# Patient Record
Sex: Male | Born: 1945 | Race: White | Hispanic: No | Smoking: Smoker, current status unknown
Health system: Southern US, Community
[De-identification: ages and names within clinical notes are randomized; demographics above are authoritative.]

## PROBLEM LIST (undated history)

## (undated) DIAGNOSIS — Z720 Tobacco use: Secondary | ICD-10-CM

## (undated) DIAGNOSIS — I10 Essential (primary) hypertension: Secondary | ICD-10-CM

## (undated) DIAGNOSIS — E785 Hyperlipidemia, unspecified: Secondary | ICD-10-CM

## (undated) DIAGNOSIS — M1990 Unspecified osteoarthritis, unspecified site: Secondary | ICD-10-CM

## (undated) DIAGNOSIS — J189 Pneumonia, unspecified organism: Secondary | ICD-10-CM

## (undated) DIAGNOSIS — J449 Chronic obstructive pulmonary disease, unspecified: Secondary | ICD-10-CM

## (undated) DIAGNOSIS — M109 Gout, unspecified: Secondary | ICD-10-CM

## (undated) DIAGNOSIS — E119 Type 2 diabetes mellitus without complications: Secondary | ICD-10-CM

## (undated) HISTORY — PX: OTHER SURGICAL HISTORY: SHX169

---

## 2004-04-07 ENCOUNTER — Ambulatory Visit: Payer: Self-pay | Admitting: Oncology

## 2005-04-11 ENCOUNTER — Ambulatory Visit: Payer: Self-pay | Admitting: Oncology

## 2006-04-22 ENCOUNTER — Ambulatory Visit: Payer: Self-pay | Admitting: Oncology

## 2015-08-05 ENCOUNTER — Inpatient Hospital Stay (HOSPITAL_COMMUNITY): Payer: Medicare Other

## 2015-08-05 ENCOUNTER — Inpatient Hospital Stay (HOSPITAL_COMMUNITY)
Admission: AD | Admit: 2015-08-05 | Discharge: 2015-09-10 | DRG: 291 | Disposition: E | Payer: Medicare Other | Source: Other Acute Inpatient Hospital | Attending: Pulmonary Disease | Admitting: Pulmonary Disease

## 2015-08-05 ENCOUNTER — Encounter (HOSPITAL_COMMUNITY): Payer: Self-pay | Admitting: Pulmonary Disease

## 2015-08-05 DIAGNOSIS — J440 Chronic obstructive pulmonary disease with acute lower respiratory infection: Secondary | ICD-10-CM | POA: Diagnosis present

## 2015-08-05 DIAGNOSIS — J9811 Atelectasis: Secondary | ICD-10-CM | POA: Diagnosis present

## 2015-08-05 DIAGNOSIS — J9601 Acute respiratory failure with hypoxia: Secondary | ICD-10-CM

## 2015-08-05 DIAGNOSIS — G9341 Metabolic encephalopathy: Secondary | ICD-10-CM | POA: Diagnosis present

## 2015-08-05 DIAGNOSIS — R7989 Other specified abnormal findings of blood chemistry: Secondary | ICD-10-CM

## 2015-08-05 DIAGNOSIS — Z66 Do not resuscitate: Secondary | ICD-10-CM | POA: Diagnosis present

## 2015-08-05 DIAGNOSIS — Z794 Long term (current) use of insulin: Secondary | ICD-10-CM

## 2015-08-05 DIAGNOSIS — I509 Heart failure, unspecified: Secondary | ICD-10-CM | POA: Insufficient documentation

## 2015-08-05 DIAGNOSIS — I5031 Acute diastolic (congestive) heart failure: Secondary | ICD-10-CM | POA: Diagnosis present

## 2015-08-05 DIAGNOSIS — I5033 Acute on chronic diastolic (congestive) heart failure: Secondary | ICD-10-CM | POA: Diagnosis not present

## 2015-08-05 DIAGNOSIS — I2510 Atherosclerotic heart disease of native coronary artery without angina pectoris: Secondary | ICD-10-CM | POA: Diagnosis present

## 2015-08-05 DIAGNOSIS — F17200 Nicotine dependence, unspecified, uncomplicated: Secondary | ICD-10-CM | POA: Diagnosis present

## 2015-08-05 DIAGNOSIS — Z7902 Long term (current) use of antithrombotics/antiplatelets: Secondary | ICD-10-CM | POA: Diagnosis not present

## 2015-08-05 DIAGNOSIS — I110 Hypertensive heart disease with heart failure: Principal | ICD-10-CM | POA: Diagnosis present

## 2015-08-05 DIAGNOSIS — E1165 Type 2 diabetes mellitus with hyperglycemia: Secondary | ICD-10-CM | POA: Diagnosis present

## 2015-08-05 DIAGNOSIS — E119 Type 2 diabetes mellitus without complications: Secondary | ICD-10-CM | POA: Diagnosis present

## 2015-08-05 DIAGNOSIS — K761 Chronic passive congestion of liver: Secondary | ICD-10-CM | POA: Diagnosis present

## 2015-08-05 DIAGNOSIS — Z6841 Body Mass Index (BMI) 40.0 and over, adult: Secondary | ICD-10-CM | POA: Diagnosis not present

## 2015-08-05 DIAGNOSIS — D649 Anemia, unspecified: Secondary | ICD-10-CM | POA: Diagnosis present

## 2015-08-05 DIAGNOSIS — R509 Fever, unspecified: Secondary | ICD-10-CM | POA: Diagnosis not present

## 2015-08-05 DIAGNOSIS — M109 Gout, unspecified: Secondary | ICD-10-CM | POA: Diagnosis present

## 2015-08-05 DIAGNOSIS — E870 Hyperosmolality and hypernatremia: Secondary | ICD-10-CM | POA: Diagnosis present

## 2015-08-05 DIAGNOSIS — R451 Restlessness and agitation: Secondary | ICD-10-CM | POA: Diagnosis present

## 2015-08-05 DIAGNOSIS — I248 Other forms of acute ischemic heart disease: Secondary | ICD-10-CM | POA: Diagnosis present

## 2015-08-05 DIAGNOSIS — T17990A Other foreign object in respiratory tract, part unspecified in causing asphyxiation, initial encounter: Secondary | ICD-10-CM | POA: Diagnosis present

## 2015-08-05 DIAGNOSIS — Z79899 Other long term (current) drug therapy: Secondary | ICD-10-CM

## 2015-08-05 DIAGNOSIS — J189 Pneumonia, unspecified organism: Secondary | ICD-10-CM | POA: Diagnosis present

## 2015-08-05 DIAGNOSIS — Z4659 Encounter for fitting and adjustment of other gastrointestinal appliance and device: Secondary | ICD-10-CM | POA: Insufficient documentation

## 2015-08-05 DIAGNOSIS — R748 Abnormal levels of other serum enzymes: Secondary | ICD-10-CM

## 2015-08-05 DIAGNOSIS — J80 Acute respiratory distress syndrome: Secondary | ICD-10-CM | POA: Diagnosis not present

## 2015-08-05 DIAGNOSIS — J9600 Acute respiratory failure, unspecified whether with hypoxia or hypercapnia: Secondary | ICD-10-CM | POA: Diagnosis not present

## 2015-08-05 DIAGNOSIS — E785 Hyperlipidemia, unspecified: Secondary | ICD-10-CM | POA: Diagnosis present

## 2015-08-05 DIAGNOSIS — I712 Thoracic aortic aneurysm, without rupture: Secondary | ICD-10-CM | POA: Diagnosis present

## 2015-08-05 DIAGNOSIS — R945 Abnormal results of liver function studies: Secondary | ICD-10-CM

## 2015-08-05 DIAGNOSIS — I5020 Unspecified systolic (congestive) heart failure: Secondary | ICD-10-CM | POA: Diagnosis not present

## 2015-08-05 DIAGNOSIS — D696 Thrombocytopenia, unspecified: Secondary | ICD-10-CM | POA: Diagnosis present

## 2015-08-05 DIAGNOSIS — Z7982 Long term (current) use of aspirin: Secondary | ICD-10-CM

## 2015-08-05 DIAGNOSIS — Z515 Encounter for palliative care: Secondary | ICD-10-CM | POA: Diagnosis present

## 2015-08-05 DIAGNOSIS — Z4502 Encounter for adjustment and management of automatic implantable cardiac defibrillator: Secondary | ICD-10-CM | POA: Insufficient documentation

## 2015-08-05 DIAGNOSIS — Z781 Physical restraint status: Secondary | ICD-10-CM

## 2015-08-05 DIAGNOSIS — Z9289 Personal history of other medical treatment: Secondary | ICD-10-CM | POA: Insufficient documentation

## 2015-08-05 DIAGNOSIS — K5909 Other constipation: Secondary | ICD-10-CM | POA: Diagnosis not present

## 2015-08-05 DIAGNOSIS — Z885 Allergy status to narcotic agent status: Secondary | ICD-10-CM

## 2015-08-05 DIAGNOSIS — R0602 Shortness of breath: Secondary | ICD-10-CM | POA: Diagnosis present

## 2015-08-05 DIAGNOSIS — R739 Hyperglycemia, unspecified: Secondary | ICD-10-CM | POA: Diagnosis not present

## 2015-08-05 HISTORY — DX: Hyperlipidemia, unspecified: E78.5

## 2015-08-05 HISTORY — DX: Pneumonia, unspecified organism: J18.9

## 2015-08-05 HISTORY — DX: Chronic obstructive pulmonary disease, unspecified: J44.9

## 2015-08-05 HISTORY — DX: Tobacco use: Z72.0

## 2015-08-05 HISTORY — DX: Unspecified osteoarthritis, unspecified site: M19.90

## 2015-08-05 HISTORY — DX: Gout, unspecified: M10.9

## 2015-08-05 HISTORY — DX: Type 2 diabetes mellitus without complications: E11.9

## 2015-08-05 HISTORY — DX: Essential (primary) hypertension: I10

## 2015-08-05 LAB — GLUCOSE, CAPILLARY
GLUCOSE-CAPILLARY: 153 mg/dL — AB (ref 65–99)
Glucose-Capillary: 168 mg/dL — ABNORMAL HIGH (ref 65–99)

## 2015-08-05 LAB — POCT I-STAT 3, ART BLOOD GAS (G3+)
ACID-BASE EXCESS: 3 mmol/L — AB (ref 0.0–2.0)
Bicarbonate: 29.7 mEq/L — ABNORMAL HIGH (ref 20.0–24.0)
O2 SAT: 92 %
PCO2 ART: 49.2 mmHg — AB (ref 35.0–45.0)
PH ART: 7.389 (ref 7.350–7.450)
TCO2: 31 mmol/L (ref 0–100)
pO2, Arterial: 66 mmHg — ABNORMAL LOW (ref 80.0–100.0)

## 2015-08-05 LAB — TROPONIN I
TROPONIN I: 0.06 ng/mL — AB (ref <0.031)
TROPONIN I: 0.06 ng/mL — AB (ref <0.031)

## 2015-08-05 LAB — MRSA PCR SCREENING: MRSA BY PCR: NEGATIVE

## 2015-08-05 LAB — PROCALCITONIN: Procalcitonin: <0.1 ng/mL

## 2015-08-05 LAB — BRAIN NATRIURETIC PEPTIDE: B Natriuretic Peptide: 77.2 pg/mL (ref 0.0–100.0)

## 2015-08-05 LAB — LACTIC ACID, PLASMA: Lactic Acid, Venous: 1.9 mmol/L (ref 0.5–2.0)

## 2015-08-05 MED ORDER — VANCOMYCIN HCL IN DEXTROSE 1-5 GM/200ML-% IV SOLN
1000.0000 mg | Freq: Once | INTRAVENOUS | Status: DC
  Filled 2015-08-05: qty 200

## 2015-08-05 MED ORDER — ASPIRIN 81 MG PO CHEW: 324.0000 mg | CHEWABLE_TABLET | ORAL | Status: AC

## 2015-08-05 MED ORDER — MIDAZOLAM HCL 2 MG/2ML IJ SOLN
1.0000 mg | INTRAMUSCULAR | Status: DC | PRN
  Administered 2015-08-05: 1 mg via INTRAVENOUS

## 2015-08-05 MED ORDER — BISACODYL 10 MG RE SUPP: 10.0000 mg | Freq: Every day | RECTAL | Status: DC | PRN

## 2015-08-05 MED ORDER — CHLORHEXIDINE GLUCONATE 0.12% ORAL RINSE (MEDLINE KIT)
15.0000 mL | Freq: Two times a day (BID) | OROMUCOSAL | Status: DC
  Administered 2015-08-06 – 2015-08-15 (×19): 15 mL via OROMUCOSAL

## 2015-08-05 MED ORDER — ANTISEPTIC ORAL RINSE SOLUTION (CORINZ)
7.0000 mL | OROMUCOSAL | Status: DC
  Administered 2015-08-06 – 2015-08-11 (×53): 7 mL via OROMUCOSAL

## 2015-08-05 MED ORDER — ALBUTEROL SULFATE (2.5 MG/3ML) 0.083% IN NEBU
2.5000 mg | INHALATION_SOLUTION | RESPIRATORY_TRACT | Status: DC | PRN
  Administered 2015-08-10: 2.5 mg via RESPIRATORY_TRACT
  Filled 2015-08-05: qty 3

## 2015-08-05 MED ORDER — SENNOSIDES 8.8 MG/5ML PO SYRP
5.0000 mL | ORAL_SOLUTION | Freq: Every day | ORAL | Status: DC
  Administered 2015-08-06 – 2015-08-08 (×3): 5 mL via ORAL
  Filled 2015-08-05 (×5): qty 5

## 2015-08-05 MED ORDER — FENTANYL CITRATE (PF) 2500 MCG/50ML IJ SOLN
25.0000 ug/h | INTRAMUSCULAR | Status: DC
  Administered 2015-08-05: 50 ug/h via INTRAVENOUS
  Administered 2015-08-06 – 2015-08-08 (×8): 400 ug/h via INTRAVENOUS
  Administered 2015-08-09: 300 ug/h via INTRAVENOUS
  Administered 2015-08-09: 350 ug/h via INTRAVENOUS
  Administered 2015-08-09: 400 ug/h via INTRAVENOUS
  Administered 2015-08-10: 100 ug/h via INTRAVENOUS
  Administered 2015-08-11: 250 ug/h via INTRAVENOUS
  Administered 2015-08-11: 350 ug/h via INTRAVENOUS
  Administered 2015-08-11: 250 ug/h via INTRAVENOUS
  Administered 2015-08-12 (×2): 400 ug/h via INTRAVENOUS
  Administered 2015-08-12 – 2015-08-13 (×2): 300 ug/h via INTRAVENOUS
  Administered 2015-08-13: 400 ug/h via INTRAVENOUS
  Administered 2015-08-13 – 2015-08-14 (×2): 300 ug/h via INTRAVENOUS
  Administered 2015-08-14: 250 ug/h via INTRAVENOUS
  Administered 2015-08-14 – 2015-08-15 (×2): 300 ug/h via INTRAVENOUS
  Filled 2015-08-05 (×28): qty 50

## 2015-08-05 MED ORDER — ONDANSETRON HCL 4 MG/2ML IJ SOLN: 4.0000 mg | Freq: Four times a day (QID) | INTRAMUSCULAR | Status: DC | PRN

## 2015-08-05 MED ORDER — CEFTRIAXONE SODIUM 1 G IJ SOLR
1.0000 g | INTRAMUSCULAR | Status: DC
  Filled 2015-08-05: qty 10

## 2015-08-05 MED ORDER — ASPIRIN 300 MG RE SUPP: 300.0000 mg | RECTAL | Status: AC

## 2015-08-05 MED ORDER — PANTOPRAZOLE SODIUM 40 MG IV SOLR
40.0000 mg | Freq: Every day | INTRAVENOUS | Status: DC
  Administered 2015-08-05: 40 mg via INTRAVENOUS
  Filled 2015-08-05: qty 40

## 2015-08-05 MED ORDER — PRO-STAT SUGAR FREE PO LIQD
30.0000 mL | Freq: Two times a day (BID) | ORAL | Status: DC
  Administered 2015-08-05 – 2015-08-06 (×2): 30 mL
  Filled 2015-08-05 (×2): qty 30

## 2015-08-05 MED ORDER — VITAL HIGH PROTEIN PO LIQD
1000.0000 mL | ORAL | Status: DC
  Administered 2015-08-05: 1000 mL

## 2015-08-05 MED ORDER — FENTANYL BOLUS VIA INFUSION
25.0000 ug | INTRAVENOUS | Status: DC | PRN
  Administered 2015-08-10: 25 ug via INTRAVENOUS
  Administered 2015-08-14: 50 ug via INTRAVENOUS
  Filled 2015-08-05: qty 25

## 2015-08-05 MED ORDER — SODIUM CHLORIDE 0.9 % IV SOLN
INTRAVENOUS | Status: DC
  Administered 2015-08-05: 18:00:00 via INTRAVENOUS

## 2015-08-05 MED ORDER — INSULIN GLARGINE 100 UNIT/ML ~~LOC~~ SOLN
5.0000 [IU] | Freq: Every day | SUBCUTANEOUS | Status: DC
  Administered 2015-08-05 – 2015-08-06 (×2): 5 [IU] via SUBCUTANEOUS
  Filled 2015-08-05 (×3): qty 0.05

## 2015-08-05 MED ORDER — CHLORHEXIDINE GLUCONATE 0.12% ORAL RINSE (MEDLINE KIT)
15.0000 mL | Freq: Two times a day (BID) | OROMUCOSAL | Status: DC
  Administered 2015-08-05: 15 mL via OROMUCOSAL

## 2015-08-05 MED ORDER — ANTISEPTIC ORAL RINSE SOLUTION (CORINZ)
7.0000 mL | Freq: Four times a day (QID) | OROMUCOSAL | Status: DC
  Administered 2015-08-05: 7 mL via OROMUCOSAL

## 2015-08-05 MED ORDER — IPRATROPIUM-ALBUTEROL 0.5-2.5 (3) MG/3ML IN SOLN
3.0000 mL | Freq: Four times a day (QID) | RESPIRATORY_TRACT | Status: DC
  Administered 2015-08-05 – 2015-08-12 (×29): 3 mL via RESPIRATORY_TRACT
  Filled 2015-08-05 (×29): qty 3

## 2015-08-05 MED ORDER — ACETAMINOPHEN 325 MG PO TABS
650.0000 mg | ORAL_TABLET | ORAL | Status: DC | PRN
  Administered 2015-08-07: 650 mg via ORAL
  Filled 2015-08-05: qty 2

## 2015-08-05 MED ORDER — DEXTROSE 5 % IV SOLN
500.0000 mg | INTRAVENOUS | Status: DC
  Filled 2015-08-05: qty 500

## 2015-08-05 MED ORDER — HEPARIN SODIUM (PORCINE) 5000 UNIT/ML IJ SOLN
5000.0000 [IU] | Freq: Three times a day (TID) | INTRAMUSCULAR | Status: DC
  Administered 2015-08-05 – 2015-08-14 (×27): 5000 [IU] via SUBCUTANEOUS
  Filled 2015-08-05 (×30): qty 1

## 2015-08-05 MED ORDER — FUROSEMIDE 10 MG/ML IJ SOLN
4.0000 mg/h | INTRAVENOUS | Status: AC
  Administered 2015-08-05 – 2015-08-07 (×2): 4 mg/h via INTRAVENOUS
  Filled 2015-08-05 (×3): qty 25

## 2015-08-05 MED ORDER — MIDAZOLAM HCL 2 MG/2ML IJ SOLN
1.0000 mg | INTRAMUSCULAR | Status: DC | PRN
  Filled 2015-08-05: qty 2

## 2015-08-05 MED ORDER — PIPERACILLIN-TAZOBACTAM 3.375 G IVPB 30 MIN
3.3750 g | Freq: Once | INTRAVENOUS | Status: DC
  Filled 2015-08-05: qty 50

## 2015-08-05 MED ORDER — SODIUM CHLORIDE 0.9 % IV SOLN: 250.0000 mL | INTRAVENOUS | Status: DC | PRN

## 2015-08-05 MED ORDER — INSULIN ASPART 100 UNIT/ML ~~LOC~~ SOLN
0.0000 [IU] | SUBCUTANEOUS | Status: DC
  Administered 2015-08-05 (×2): 4 [IU] via SUBCUTANEOUS
  Administered 2015-08-06: 7 [IU] via SUBCUTANEOUS
  Administered 2015-08-06: 3 [IU] via SUBCUTANEOUS
  Administered 2015-08-06: 4 [IU] via SUBCUTANEOUS
  Administered 2015-08-06: 7 [IU] via SUBCUTANEOUS
  Administered 2015-08-06: 11 [IU] via SUBCUTANEOUS
  Administered 2015-08-07 (×4): 4 [IU] via SUBCUTANEOUS
  Administered 2015-08-07: 15 [IU] via SUBCUTANEOUS
  Administered 2015-08-07: 20 [IU] via SUBCUTANEOUS
  Administered 2015-08-07 – 2015-08-08 (×3): 11 [IU] via SUBCUTANEOUS
  Administered 2015-08-08: 20 [IU] via SUBCUTANEOUS
  Administered 2015-08-08 – 2015-08-09 (×5): 11 [IU] via SUBCUTANEOUS
  Administered 2015-08-09: 20 [IU] via SUBCUTANEOUS
  Administered 2015-08-09: 11 [IU] via SUBCUTANEOUS
  Administered 2015-08-09: 15 [IU] via SUBCUTANEOUS
  Administered 2015-08-09 – 2015-08-10 (×2): 11 [IU] via SUBCUTANEOUS
  Administered 2015-08-10: 7 [IU] via SUBCUTANEOUS
  Administered 2015-08-10 (×2): 11 [IU] via SUBCUTANEOUS
  Administered 2015-08-10: 4 [IU] via SUBCUTANEOUS
  Administered 2015-08-11 (×2): 11 [IU] via SUBCUTANEOUS
  Administered 2015-08-11 (×2): 7 [IU] via SUBCUTANEOUS
  Administered 2015-08-11 (×2): 15 [IU] via SUBCUTANEOUS
  Administered 2015-08-12 (×2): 11 [IU] via SUBCUTANEOUS
  Administered 2015-08-12: 4 [IU] via SUBCUTANEOUS
  Administered 2015-08-12: 11 [IU] via SUBCUTANEOUS
  Administered 2015-08-12: 4 [IU] via SUBCUTANEOUS
  Administered 2015-08-12: 7 [IU] via SUBCUTANEOUS
  Administered 2015-08-12 – 2015-08-13 (×2): 11 [IU] via SUBCUTANEOUS
  Administered 2015-08-13: 15 [IU] via SUBCUTANEOUS
  Administered 2015-08-13 (×2): 11 [IU] via SUBCUTANEOUS

## 2015-08-05 MED ORDER — DOCUSATE SODIUM 50 MG/5ML PO LIQD: 50.0000 mg | Freq: Every day | ORAL | Status: DC

## 2015-08-05 MED ORDER — FENTANYL CITRATE (PF) 100 MCG/2ML IJ SOLN
50.0000 ug | Freq: Once | INTRAMUSCULAR | Status: AC
  Administered 2015-08-05: 50 ug via INTRAVENOUS
  Filled 2015-08-05: qty 2

## 2015-08-05 NOTE — H&P (Signed)
PULMONARY / CRITICAL CARE MEDICINE   Name: Zachary BeckersRoy N Kitchings MRN: [id number] DOB: 03-17-1945    ADMISSION DATE:  16-Nov-2015 CONSULTATION DATE:  10-20-15  REFERRING MD:  [facility]   CHIEF COMPLAINT:  Acute Respiratory Failure  HISTORY OF PRESENT ILLNESS:   70 y/o M, smoker, with PMH of DM, HTN, HLD, Gout, Osteoarthritis, and COPD (pt apparently not aware of dx) who presented to [facility] on 5/21 with complaints of shortness of breath.    The patient reported that SOB began on 5/20 and into 5/21 prompting evaluation.  He went out to mow ride mow his yard and became very fatigued - to the point he could not get the lawn mover back to the house.  He was recently admitted in March 2017 for hypercarbic respiratory failure thought related to PNA.  He reported symptoms were chest tightness and air hunger according to admission documentation.  Work up was concerning for hypercarbic respiratory failure with ABG showing PCO2 >100.  He decompensated and required intubation.  The patient was treated with rocephin / azithromycin for PNA without significant improvement.  CXR / CT chest worsened with layering effusions.  ETT was exchanged to a #9.0 on 5/26.  He continued to require increasing amounts of PEEP / FiO2 support and was [facility] for [facility].  [facility] - 5/25 WBC 12.4, hgb 16.8, platelets 148, Na 136, K 4.5, Cl 100, AG 13, Sr Cr 1.0, glucose 265.  ABG 7.410 / 47 / 48 / 29.  UA WBC 40-50, few bacteria, nitrite neg, trace glucose.    PCCM accepting for admission.    PAST MEDICAL HISTORY :  He  has a past medical history of Hypertension; COPD (chronic obstructive pulmonary disease) (HCC); HLD (hyperlipidemia); Tobacco abuse; Pneumonia; Gout; Osteoarthritis; and Diabetes (HCC).  PAST SURGICAL HISTORY: He  has past surgical history that includes none.  Allergies  Allergen Reactions  . Codeine Nausea And Vomiting    No current facility-administered medications  on file prior to encounter.   No current outpatient prescriptions on file prior to encounter.   HOME MEDS PTA:  Amlodipine Atorvastatin  Benazepril Plavix  Fish Oil  ASA  Coreg  Gabapentin  Norco  Insulin 70/30  Claritin    FAMILY HISTORY:  His has no family status information on file.   SOCIAL HISTORY: He  reports that he has been smoking.  He does not have any smokeless tobacco history on file.  REVIEW OF SYSTEMS:  Unable to complete with patient due to intubation / mechanical vent.   SUBJECTIVE:   VITAL SIGNS: BP 107/61 mmHg  Pulse 70  Resp 18  Ht 5\' 11"  (1.803 m)  SpO2 89%  HEMODYNAMICS:    VENTILATOR SETTINGS: Vent Mode:  [-] PRVC FiO2 (%):  [100 %] 100 % Set Rate:  [18 bmp] 18 bmp Vt Set:  [600 mL] 600 mL PEEP:  [14 cmH20] 14 cmH20 Plateau Pressure:  [22 cmH20] 22 cmH20  INTAKE / OUTPUT:    PHYSICAL EXAMINATION: General:  Obese male in NAD on vent  Neuro:  Sedate on propofol, with lightened sedation he opens eyes and attempts to moves LUE spontaneously  HEENT:  ETT, mm pink/moist, scleral edema, left scleral hemorrhage  Cardiovascular:  s1s2 distant, regular on monitor Lungs:  Even/non-labored on vent, mild tachypnea with decreased sedation, diminished on L with wheezing, R coarse Abdomen:  Obese/protrubant, soft, BSx4 active  Musculoskeletal:  No acute deformities  Skin:  Warm/dry, no edema, multiple  scattered old scars on BLE's   LABS:  BMET No results for input(s): NA, K, CL, CO2, BUN, CREATININE, GLUCOSE in the last 168 hours.  Electrolytes No results for input(s): CALCIUM, MG, PHOS in the last 168 hours.  CBC No results for input(s): WBC, HGB, HCT, PLT in the last 168 hours.  Coag's No results for input(s): APTT, INR in the last 168 hours.  Sepsis Markers No results for input(s): LATICACIDVEN, PROCALCITON, O2SATVEN in the last 168 hours.  ABG No results for input(s): PHART, PCO2ART, PO2ART in the last 168 hours.  Liver  Enzymes No results for input(s): AST, ALT, ALKPHOS, BILITOT, ALBUMIN in the last 168 hours.  Cardiac Enzymes No results for input(s): TROPONINI, PROBNP in the last 168 hours.  Glucose No results for input(s): GLUCAP in the last 168 hours.  Imaging Dg Chest Port 1 View  07/28/2015  CLINICAL DATA:  Acute respiratory failure EXAM: PORTABLE CHEST 1 VIEW COMPARISON:  Chest x-rays dated 08/10/2015, 08/03/2015 and 08/02/2015 FINDINGS: Endotracheal tube is now well positioned with tip just above the level of the carina. Enteric tube passes below the diaphragm. Cardiomegaly is grossly stable. Compared to the chest x-ray from earlier today, there is improved aeration within the left upper lobe. Persistent dense opacity at the left lung base likely represents a combination of airspace collapse and pleural effusion. Persistent opacity within the left upper lobe is most likely atelectasis or confluent edema. Patchy opacities are again seen within the mid and lower zones of the right lung, likely a combination of edema atelectasis. IMPRESSION: Interval retraction of the endotracheal tube, now well positioned with tip approximately 3 cm above the level of the carina. Improved aeration within the left upper lobe compared to the chest x-ray from earlier today. No other significant change in the short-term interval. Electronically Signed   By: Bary Richard M.D.   On: 07/11/2015 16:44     STUDIES:  CT Chest 5/26 (OSH) >> no evidence of PE, extensive atx of LUL, lingula, bilateral lower lobes, small bilateral effusions, 4.5 cm ascending thoracic aortic aneurysm recommended for semi-annual follow up with Thoracic Surgery ECHO 5/26 >>  CULTURES: OSH  UC 5/22 >> ngtd >> BCx2 5/21 >>  Tracheal Aspirate 5/21 >> normal flora   BCx2 5/26 >>  Tracheal Aspirate 5/26 >>  U. Strep 5/26 >>  U. Legionella 5/26 >>   ANTIBIOTICS: None.  SIGNIFICANT EVENTS: 5/21  Admit to [facility] with respiratory distress,  intubated, thought CAP, +/- CHF   LINES/TUBES: ETT 5/21 >> exchanged 5/26 >>  PIV  DISCUSSION: 70 y/o M with PMH of DM, HTN, COPD and recent admit for CAP admitted 5/21 to [facility] with acute respiratory failure.  Progressed to hypercarbic failure and intubated.  Tx to [facility] 5/26.  Concern for possible HCAP with recent admit in March 2017 and CHF exacerbation with layering pleural effusions.   ASSESSMENT / PLAN:  PULMONARY A: Acute Hypoxemic Respiratory Failure - suspect related to CHF given no real response to ABX CAP  Bilateral Atelectasis / Mucus Plugging Bilateral Pleural Effusions Suspected COPD  P:   PRVC 8cc/kg Wean FiO2 for sats > 92% Intermittent CXR Scheduled duonebs Q6 + PRN albuterol   Keep PEEP at 16, attempt to recruit  Repeat cultures as above Hold abx for now ABG in one hour  CARDIOVASCULAR A:  R/O CHF  Hx HTN, HLD Atherosclerotic Coronary Disease - noted on CT Ascending Thoracic Aneurysm - noted on 5/26 CT at [place].  P:  Assess ECHO  Continue Lasix gtt Assess troponin, BNP trend  EKG in am  RENAL A:   At Risk for AKI - in setting of contrast administration for CTA chest + lasix gtt P:   Trend UOP  BMP Q12 with lasix gtt  Replace electrolytes as indicated  Repeat labs now   GASTROINTESTINAL A:   Morbid Obesity  P:   Continue NGT Begin TF  PPI for SUP   HEMATOLOGIC A:   Mild Anemia - no evidence of acute bleeding  P:  Heparin for DVT prophylaxis  Trend CBC   INFECTIOUS A:   R/o PNA - doubt infectious process, concern for CHF  P:   Repeat cultures  Follow [facility] from [facility]   ENDOCRINE A:   DM II   P:   CBG Q4 with SSI  Lantus 5 units QHS  NEUROLOGIC A:   Acute Encephalopathy - in setting of respiratory failure, hypercarbia  P:   RASS goal: -1 Fentanyl gtt for pain / sedation  PRN versed for sedation   FAMILY  - Updates: Family updated at bedside 5/26 per Dr. Jamison Neighbor  - Inter-disciplinary family meet  or Palliative Care meeting due by:  6/1    Canary Brim, NP-C [place] [facility] Pgr: [phone] or if no answer [phone] 07/13/2015, 4:51 PM  PCCM Attending Note: Patient seen and examined with nurse practitioner. Please refer to her admission H&P which I reviewed in detail. Suspect patient's hypoxia is cardiac in etiology. Unclear duration of use of continuous Lasix infusion. We'll need to further investigate cardiac function with transthoracic echocardiogram. Starting tube feedings. Checking electrolytes as well as repeat cultures. Holding on empiric antibiotics at this time. No signs of COPD exacerbation and only recent diagnosis of "COPD". Continuing scheduled nebulizer therapies for now but holding on steroid therapy. Continuing alveolar recruitment with PEEP given Plateau pressure and high transpulmonary pressure gradient. Patient's family updated at length by myself at bedside. Patient may require right heart catheterization to better assess his volume status. Reportedly patient was spontaneously moving all 4 extremities with lightening of sedation and myself saw him up in his eyes responding and attending to voice calling his name/nickname.  I have personally spent a total of 33 minutes of critical care time today caring for the patient, reviewing the patient's medical records, and discussing the plan of care with family at bedside.  Donna Christen Jamison Neighbor, M.D. [facility] Pager:  [phone] After 3pm or if no response, call [phone] 5:54 PM 07/22/2015

## 2015-08-06 ENCOUNTER — Inpatient Hospital Stay (HOSPITAL_COMMUNITY): Payer: Medicare Other

## 2015-08-06 DIAGNOSIS — J80 Acute respiratory distress syndrome: Secondary | ICD-10-CM

## 2015-08-06 DIAGNOSIS — I5031 Acute diastolic (congestive) heart failure: Secondary | ICD-10-CM

## 2015-08-06 LAB — CBC
HEMATOCRIT: 53.1 % — AB (ref 39.0–52.0)
HEMOGLOBIN: 16.9 g/dL (ref 13.0–17.0)
MCH: 28.4 pg (ref 26.0–34.0)
MCHC: 31.8 g/dL (ref 30.0–36.0)
MCV: 89.2 fL (ref 78.0–100.0)
Platelets: 143 10*3/uL — ABNORMAL LOW (ref 150–400)
RBC: 5.95 MIL/uL — ABNORMAL HIGH (ref 4.22–5.81)
RDW: 13.7 % (ref 11.5–15.5)
WBC: 11.8 10*3/uL — ABNORMAL HIGH (ref 4.0–10.5)

## 2015-08-06 LAB — MAGNESIUM: Magnesium: 2.4 mg/dL (ref 1.7–2.4)

## 2015-08-06 LAB — BASIC METABOLIC PANEL
Anion gap: 9 (ref 5–15)
BUN: 47 mg/dL — ABNORMAL HIGH (ref 6–20)
CHLORIDE: 100 mmol/L — AB (ref 101–111)
CO2: 33 mmol/L — AB (ref 22–32)
CREATININE: 1.13 mg/dL (ref 0.61–1.24)
Calcium: 7.9 mg/dL — ABNORMAL LOW (ref 8.9–10.3)
GFR calc non Af Amer: >60 mL/min (ref >60)
GLUCOSE: 219 mg/dL — AB (ref 65–99)
Potassium: 3.7 mmol/L (ref 3.5–5.1)
Sodium: 142 mmol/L (ref 135–145)

## 2015-08-06 LAB — BLOOD GAS, ARTERIAL
Acid-Base Excess: 6.5 mmol/L — ABNORMAL HIGH (ref 0.0–2.0)
Bicarbonate: 31 mEq/L — ABNORMAL HIGH (ref 20.0–24.0)
Drawn by: 44135
FIO2: 1
LHR: 18 {breaths}/min
O2 SAT: 96.2 %
PATIENT TEMPERATURE: 98.6
PCO2 ART: 48.8 mmHg — AB (ref 35.0–45.0)
PEEP: 16 cmH2O
PH ART: 7.419 (ref 7.350–7.450)
PO2 ART: 85.6 mmHg (ref 80.0–100.0)
TCO2: 32.5 mmol/L (ref 0–100)
VT: 600 mL

## 2015-08-06 LAB — GLUCOSE, CAPILLARY
GLUCOSE-CAPILLARY: 132 mg/dL — AB (ref 65–99)
GLUCOSE-CAPILLARY: 213 mg/dL — AB (ref 65–99)
Glucose-Capillary: 104 mg/dL — ABNORMAL HIGH (ref 65–99)
Glucose-Capillary: 184 mg/dL — ABNORMAL HIGH (ref 65–99)
Glucose-Capillary: 185 mg/dL — ABNORMAL HIGH (ref 65–99)
Glucose-Capillary: 221 mg/dL — ABNORMAL HIGH (ref 65–99)
Glucose-Capillary: 225 mg/dL — ABNORMAL HIGH (ref 65–99)

## 2015-08-06 LAB — COMPREHENSIVE METABOLIC PANEL
ALK PHOS: 79 U/L (ref 38–126)
ALT: 199 U/L — AB (ref 17–63)
ANION GAP: 9 (ref 5–15)
AST: 143 U/L — ABNORMAL HIGH (ref 15–41)
Albumin: 2.8 g/dL — ABNORMAL LOW (ref 3.5–5.0)
BUN: 43 mg/dL — ABNORMAL HIGH (ref 6–20)
CALCIUM: 7.7 mg/dL — AB (ref 8.9–10.3)
CO2: 31 mmol/L (ref 22–32)
CREATININE: 1.26 mg/dL — AB (ref 0.61–1.24)
Chloride: 101 mmol/L (ref 101–111)
GFR, EST NON AFRICAN AMERICAN: 56 mL/min — AB (ref >60)
Glucose, Bld: 130 mg/dL — ABNORMAL HIGH (ref 65–99)
Potassium: 3.8 mmol/L (ref 3.5–5.1)
SODIUM: 141 mmol/L (ref 135–145)
TOTAL PROTEIN: 5.2 g/dL — AB (ref 6.5–8.1)
Total Bilirubin: 1 mg/dL (ref 0.3–1.2)

## 2015-08-06 LAB — STREP PNEUMONIAE URINARY ANTIGEN: STREP PNEUMO URINARY ANTIGEN: NEGATIVE

## 2015-08-06 LAB — ECHOCARDIOGRAM COMPLETE
HEIGHTINCHES: 71 in
WEIGHTICAEL: 4864.23 [oz_av]

## 2015-08-06 LAB — PROCALCITONIN

## 2015-08-06 LAB — PHOSPHORUS: Phosphorus: 5 mg/dL — ABNORMAL HIGH (ref 2.5–4.6)

## 2015-08-06 LAB — TROPONIN I: TROPONIN I: 0.06 ng/mL — AB (ref <0.031)

## 2015-08-06 MED ORDER — SODIUM CHLORIDE 0.9 % IV SOLN
0.0000 mg/h | INTRAVENOUS | Status: DC
  Administered 2015-08-06: 1 mg/h via INTRAVENOUS
  Administered 2015-08-07: 5 mg/h via INTRAVENOUS
  Administered 2015-08-07: 4 mg/h via INTRAVENOUS
  Administered 2015-08-08 – 2015-08-09 (×2): 5 mg/h via INTRAVENOUS
  Administered 2015-08-09 – 2015-08-10 (×2): 4 mg/h via INTRAVENOUS
  Filled 2015-08-06 (×9): qty 10

## 2015-08-06 MED ORDER — SODIUM CHLORIDE 0.9 % IV SOLN: 8.0000 mg/h | INTRAVENOUS | Status: DC

## 2015-08-06 MED ORDER — SODIUM CHLORIDE 0.9 % IV SOLN: 80.0000 mg | Freq: Once | INTRAVENOUS | Status: DC

## 2015-08-06 MED ORDER — MIDAZOLAM HCL 2 MG/2ML IJ SOLN
1.0000 mg | INTRAMUSCULAR | Status: DC | PRN
  Administered 2015-08-06 (×2): 1 mg via INTRAVENOUS
  Filled 2015-08-06: qty 2

## 2015-08-06 MED ORDER — PANTOPRAZOLE SODIUM 40 MG IV SOLR: 40.0000 mg | Freq: Two times a day (BID) | INTRAVENOUS | Status: DC

## 2015-08-06 MED ORDER — VITAL HIGH PROTEIN PO LIQD
1000.0000 mL | ORAL | Status: DC
  Administered 2015-08-06
  Administered 2015-08-06 (×2): 1000 mL
  Administered 2015-08-07: 11:00:00
  Administered 2015-08-08 – 2015-08-12 (×9): 1000 mL
  Administered 2015-08-12: 08:00:00
  Administered 2015-08-12: 1000 mL
  Administered 2015-08-12 (×5)
  Administered 2015-08-13 – 2015-08-14 (×3): 1000 mL

## 2015-08-06 MED ORDER — PERFLUTREN LIPID MICROSPHERE
1.0000 mL | INTRAVENOUS | Status: AC | PRN
  Administered 2015-08-06: 3 mL via INTRAVENOUS
  Filled 2015-08-06: qty 10

## 2015-08-06 MED ORDER — PANTOPRAZOLE SODIUM 40 MG IV SOLR
40.0000 mg | INTRAVENOUS | Status: DC
  Administered 2015-08-06 – 2015-08-09 (×4): 40 mg via INTRAVENOUS
  Filled 2015-08-06 (×4): qty 40

## 2015-08-06 NOTE — Progress Notes (Signed)
Initial Nutrition Assessment  DOCUMENTATION CODES:   Morbid obesity  INTERVENTION:  Initiate TF via OGT with Vital High Protein at goal rate of 75 ml/h (1800 ml per day) and to provide 1800 kcals, 157 gm protein, 1512 ml free water daily.  NUTRITION DIAGNOSIS:   Inadequate oral intake related to inability to eat as evidenced by NPO status.   GOAL:   Provide needs based on ASPEN/SCCM guidelines   MONITOR:   TF tolerance, Skin, I & O's, Labs, Weight trends, Vent status  REASON FOR ASSESSMENT:    Consult Enteral/tube feeding initiation and management  ASSESSMENT:   70 y/o M with PMH of DM, HTN, COPD and recent admit for CAP admitted [facility] with acute respiratory failure. Progressed to hypercarbic failure and intubated. [facility]. Concern for possible HCAP with recent admit in March 2017 and CHF exacerbation with layering pleural effusions.   Patient is currently intubated on ventilator support MV: 10.6 L/min Temp (24hrs), Avg:98.3 F (36.8 C), Min:98.1 F (36.7 C), Max:98.4 F (36.9 C)  TF initiated late last night. Pt currently receiving Vital AF 1.2 @ 40 ml/hr via OGT and gets pro-stat BID which provides 1352 kcal and 102 grams of protein. He is morbidly obese.  Labs: elevated phosphorus, low calcium  Diet Order:  Diet NPO time specified  Skin:  Reviewed, no issues  Last BM:  unknown  Height:   Ht Readings from Last 1 Encounters:  March 16, 2015 5\' 11"  (1.803 m)    Weight:   Wt Readings from Last 1 Encounters:  08/06/15 304 lb 0.2 oz (137.9 kg)    Ideal Body Weight:  78.2 kg  BMI:  Body mass index is 42.42 kg/(m^2).  Estimated Nutritional Needs:   Kcal:  [id number]  Protein:  156-195 grams  Fluid:  per MD  EDUCATION NEEDS:   No education needs identified at this time  Dorothea Ogleeanne Kentley Cedillo RD, LDN Inpatient Clinical Dietitian Pager: [phone] After Hours Pager: [phone]

## 2015-08-06 NOTE — Progress Notes (Signed)
eLink Physician-Brief Progress Note Patient Name: Zachary Blevins DOB: Apr 01, 1945 MRN: [id number]   Date of Service  08/06/2015  HPI/Events of Note  Agitation - Already on a Fentanyl IV infusion at 400 mcg/hour.   eICU Interventions  Will increase the Versed 1 mg IV PRN dose to Q 1 hour.      Intervention Category Minor Interventions: Agitation / anxiety - evaluation and management  [name],[name] 08/06/2015, 12:37 AM

## 2015-08-06 NOTE — Progress Notes (Signed)
  Echocardiogram 2D Echocardiogram has been performed.  Zachary Blevins 08/06/2015, 10:16 AM

## 2015-08-06 NOTE — Progress Notes (Signed)
PULMONARY / CRITICAL CARE MEDICINE   Name: Zachary Blevins MRN: [id number] DOB: 08-24-1945    ADMISSION DATE:  07/18/2015 CONSULTATION DATE:  08/06/2015  REFERRING MD:  [facility]   CHIEF COMPLAINT:  Acute Respiratory Failure  SUBJECTIVE:  Periods of agitation overnight, on fentanyl / versed gtt's.  Mild elevation of troponin overnight.  Net neg 1.6 L  VITAL SIGNS: BP 136/61 mmHg  Pulse 73  Temp(Src) 98.4 F (36.9 C) (Oral)  Resp 13  Ht  (1.803 m)  Wt 304 lb 0.2 oz (137.9 kg)  BMI 42.42 kg/m2  SpO2 93%  HEMODYNAMICS:    VENTILATOR SETTINGS: Vent Mode:  [-] PRVC FiO2 (%):  [80 %-100 %] 80 % Set Rate:  [18 bmp] 18 bmp Vt Set:  [600 mL] 600 mL PEEP:  [14 cmH20-16 cmH20] 16 cmH20 Plateau Pressure:  [22 cmH20-27 cmH20] 26 cmH20  INTAKE / OUTPUT: I/O last 3 completed shifts: In: 905.5 [P.O.:30; I.V.:487.5; Other:80; NG/GT:308] Out: 2550 [Urine:2550]  PHYSICAL EXAMINATION: General:  Obese male in NAD on vent  Neuro:  Sedate, periods of intermittent agitation  HEENT:  ETT, mm pink/moist, scleral edema, left scleral hemorrhage  Cardiovascular:  s1s2 distant, regular on monitor Lungs:  Even/non-labored on vent, coarse bilaterally  Abdomen:  Obese/protrubant, soft, BSx4 active  Musculoskeletal:  No acute deformities  Skin:  Warm/dry, no edema, multiple scattered old scars on BLE's   LABS:  BMET  Recent Labs Lab 08/06/15 0448  NA 141  K 3.8  CL 101  CO2 31  BUN 43*  CREATININE 1.26*  GLUCOSE 130*    Electrolytes  Recent Labs Lab 08/06/15 0448  CALCIUM 7.7*  MG 2.4  PHOS 5.0*    CBC  Recent Labs Lab 08/06/15 0448  WBC 11.8*  HGB 16.9  HCT 53.1*  PLT 143*    Coag's No results for input(s): APTT, INR in the last 168 hours.  Sepsis Markers  Recent Labs Lab 07/13/2015 1719 08/06/15 0448  LATICACIDVEN 1.9  --   PROCALCITON <0.10 <0.10    ABG  Recent Labs Lab 07/26/2015 1827 08/06/15 0345  PHART 7.389 7.419  PCO2ART 49.2*  48.8*  PO2ART 66.0* 85.6    Liver Enzymes  Recent Labs Lab 08/06/15 0448  AST 143*  ALT 199*  ALKPHOS 79  BILITOT 1.0  ALBUMIN 2.8*    Cardiac Enzymes  Recent Labs Lab 07/14/2015 1719 07/22/2015 2304 08/06/15 0448  TROPONINI 0.06* 0.06* 0.06*    Glucose  Recent Labs Lab 08/01/2015 1608 07/27/2015 1939 08/06/15 0050 08/06/15 0316 08/06/15 0810  GLUCAP 168* 153* 104* 132* 185*    Imaging - IMAGES PERSONALLY REVIEWED Dg Chest Port 1 View  08/04/2015  CLINICAL DATA:  Acute respiratory failure EXAM: PORTABLE CHEST 1 VIEW COMPARISON:  Chest x-rays dated 07/24/2015, 08/03/2015 and 08/02/2015 FINDINGS: Endotracheal tube is now well positioned with tip just above the level of the carina. Enteric tube passes below the diaphragm. Cardiomegaly is grossly stable. Compared to the chest x-ray from earlier today, there is improved aeration within the left upper lobe. Persistent dense opacity at the left lung base likely represents a combination of airspace collapse and pleural effusion. Persistent opacity within the left upper lobe is most likely atelectasis or confluent edema. Patchy opacities are again seen within the mid and lower zones of the right lung, likely a combination of edema atelectasis. IMPRESSION: Interval retraction of the endotracheal tube, now well positioned with tip approximately 3 cm above the level of the carina. Improved aeration  within the left upper lobe compared to the chest x-ray from earlier today. No other significant change in the short-term interval. Electronically Signed   By: Bary Richard M.D.   On: 07/14/2015 16:44     STUDIES:  CT Chest 5/26 (OSH) >> no evidence of PE, extensive atx of LUL, lingula, bilateral lower lobes, small bilateral effusions, 4.5 cm ascending thoracic aortic aneurysm recommended for semi-annual follow up with Thoracic Surgery ECHO 5/26 >> LVEF 60-65%, no wall motion, grade 1 diastolic dysfunction, mild aortic root  dilation  CULTURES: OSH  UC 5/22 >> ngtd >> BCx2 5/21 >>  Tracheal Aspirate 5/21 >> normal flora   BCx2 5/26 >>  Tracheal Aspirate 5/26 >>  U. Strep 5/26 >> negative U. Legionella 5/26 >>   ANTIBIOTICS: None.  SIGNIFICANT EVENTS: 5/21  Admit to [facility] with respiratory distress, intubated, thought CAP, +/- CHF   LINES/TUBES: ETT 5/21 >> exchanged 5/26 >>  PIV  DISCUSSION: 70 y/o M with PMH of DM, HTN, COPD and recent admit for CAP admitted 5/21 to [facility] with acute respiratory failure.  Progressed to hypercarbic failure and intubated.  Tx to [facility] 5/26.  Concern for possible HCAP with recent admit in March 2017 and CHF exacerbation with layering pleural effusions.   ASSESSMENT / PLAN:  PULMONARY A: Acute Hypoxemic Respiratory Failure - suspect related to CHF given no real response to ABX CAP  Bilateral Atelectasis / Mucus Plugging Bilateral Pleural Effusions Suspected COPD  P:   PRVC 8cc/kg Wean FiO2 for sats > 92% Keep PEEP 16 for now Intermittent CXR Scheduled duonebs Q6 + PRN albuterol  Repeat cultures as above No indication for abx at this point Intermittent ABG  CARDIOVASCULAR A:  CHF exacerbation  Hx HTN, HLD Atherosclerotic Coronary Disease - noted on CT Mild Elevation of Troponin - likely demand ischemia  Ascending Thoracic Aneurysm - noted on 5/26 CT at [place].   P:  Await ECHO  Continue Lasix gtt @ 4mg  /hr Assess troponin, BNP trend  ICU monitoring  Improvement in CXR with lasix gtt  RENAL A:   At Risk for AKI - in setting of contrast administration for CTA chest + lasix gtt P:   Trend UOP  BMP Q12 with lasix gtt  Replace electrolytes as indicated  NS @ KVO   GASTROINTESTINAL A:   Morbid Obesity  Elevated LFT's - ? Congestive hepatopathy with CHF P:   Continue NGT Begin TF  PPI for SUP  Trend LFT's   HEMATOLOGIC A:   Mild Anemia - no evidence of acute bleeding  Mild Thrombocytopenia  P:  Heparin for DVT  prophylaxis  Trend CBC   INFECTIOUS A:   R/o PNA - doubt infectious process, concern for CHF  P:   Repeat cultures  Follow cultures from [facility]   ENDOCRINE A:   DM II   P:   CBG Q4 with SSI  Lantus 5 units QHS  NEUROLOGIC A:   Acute Encephalopathy - in setting of respiratory failure, hypercarbia  P:   RASS goal: -1 Fentanyl gtt for pain / sedation  Versed gtt for sedation, failed intermittent pushes   FAMILY  - Updates: No family available am 5/27.    - Inter-disciplinary family meet or Palliative Care meeting due by:  6/1    Canary Brim, NP-C [facility] Pgr: [phone] or if no answer [phone] 08/06/2015, 9:13 AM  PCCM Attending Note: Patient seen and examined with nurse practitioner. Please refer to progress  note which I reviewed in detail. Oxygen requirement on ventilator continues to progressively improve. Given findings on transthoracic echocardiogram I'm highly suspicious for diastolic dysfunction. With normal Procalcitonin infectious etiology is significantly less likely. Continuing diuresis while trending renal function. Patient's family updated at length bedside today by myself.  I spent a total of 31 minutes of critical care time today caring for the patient, updating the patient's family, and reviewing the patient's electronic medical record.  Donna ChristenJennings E. Jamison NeighborNestor, M.D. [facility] Pager:  [phone] After 3pm or if no response, call [phone] 8:21 PM 08/06/2015

## 2015-08-06 NOTE — Progress Notes (Signed)
eLink Physician-Brief Progress Note Patient Name: Zachary Blevins DOB: [birth year] MRN: [id number]   Date of Service  08/06/2015  HPI/Events of Note  Troponin = 0.06. Demand ischemia? Already on ASA.   eICU Interventions  Will order: 1. 12 Lead EKG now.  Continue to trend Troponin.        Germani Gavilanes Dennard Nipugene 08/06/2015, 12:43 AM

## 2015-08-06 NOTE — Progress Notes (Signed)
eLink Physician-Brief Progress Note Patient Name: Zachary Blevins DOB: [birth year] MRN: [id number]   Date of Service  08/06/2015  HPI/Events of Note  Agitation - Maximum dose Fentanyl IV infusion and Versed IV PRN.  eICU Interventions  Will order a Versed IV infusion. Titrate to RASS = 0.     Intervention Category Minor Interventions: Agitation / anxiety - evaluation and management  Sommer,Steven Eugene 08/06/2015, 2:36 AM

## 2015-08-07 ENCOUNTER — Inpatient Hospital Stay (HOSPITAL_COMMUNITY): Payer: Medicare Other

## 2015-08-07 DIAGNOSIS — R739 Hyperglycemia, unspecified: Secondary | ICD-10-CM

## 2015-08-07 LAB — CBC
HCT: 53.2 % — ABNORMAL HIGH (ref 39.0–52.0)
HEMOGLOBIN: 16.9 g/dL (ref 13.0–17.0)
MCH: 28.6 pg (ref 26.0–34.0)
MCHC: 31.8 g/dL (ref 30.0–36.0)
MCV: 90 fL (ref 78.0–100.0)
PLATELETS: 143 10*3/uL — AB (ref 150–400)
RBC: 5.91 MIL/uL — AB (ref 4.22–5.81)
RDW: 13.7 % (ref 11.5–15.5)
WBC: 12.9 10*3/uL — AB (ref 4.0–10.5)

## 2015-08-07 LAB — BASIC METABOLIC PANEL
Anion gap: 10 (ref 5–15)
BUN: 42 mg/dL — AB (ref 6–20)
CHLORIDE: 101 mmol/L (ref 101–111)
CO2: 33 mmol/L — ABNORMAL HIGH (ref 22–32)
Calcium: 8 mg/dL — ABNORMAL LOW (ref 8.9–10.3)
Creatinine, Ser: 1.13 mg/dL (ref 0.61–1.24)
GFR calc Af Amer: >60 mL/min (ref >60)
GFR calc non Af Amer: >60 mL/min (ref >60)
GLUCOSE: 168 mg/dL — AB (ref 65–99)
POTASSIUM: 3.8 mmol/L (ref 3.5–5.1)
SODIUM: 144 mmol/L (ref 135–145)

## 2015-08-07 LAB — HEPATIC FUNCTION PANEL
ALBUMIN: 2.9 g/dL — AB (ref 3.5–5.0)
ALT: 164 U/L — ABNORMAL HIGH (ref 17–63)
AST: 41 U/L (ref 15–41)
Alkaline Phosphatase: 86 U/L (ref 38–126)
BILIRUBIN TOTAL: 1.4 mg/dL — AB (ref 0.3–1.2)
Bilirubin, Direct: 0.6 mg/dL — ABNORMAL HIGH (ref 0.1–0.5)
Indirect Bilirubin: 0.8 mg/dL (ref 0.3–0.9)
TOTAL PROTEIN: 5.7 g/dL — AB (ref 6.5–8.1)

## 2015-08-07 LAB — GLUCOSE, CAPILLARY
GLUCOSE-CAPILLARY: 155 mg/dL — AB (ref 65–99)
GLUCOSE-CAPILLARY: 165 mg/dL — AB (ref 65–99)
GLUCOSE-CAPILLARY: 283 mg/dL — AB (ref 65–99)
Glucose-Capillary: 164 mg/dL — ABNORMAL HIGH (ref 65–99)
Glucose-Capillary: 340 mg/dL — ABNORMAL HIGH (ref 65–99)
Glucose-Capillary: 366 mg/dL — ABNORMAL HIGH (ref 65–99)

## 2015-08-07 LAB — PROCALCITONIN: Procalcitonin: 0.13 ng/mL

## 2015-08-07 MED ORDER — QUETIAPINE FUMARATE 25 MG PO TABS
25.0000 mg | ORAL_TABLET | Freq: Every day | ORAL | Status: DC
  Administered 2015-08-07 – 2015-08-09 (×3): 25 mg via ORAL
  Filled 2015-08-07 (×3): qty 1

## 2015-08-07 MED ORDER — NYSTATIN 100000 UNIT/ML MT SUSP
5.0000 mL | Freq: Four times a day (QID) | OROMUCOSAL | Status: DC
  Administered 2015-08-07 – 2015-08-13 (×28): 500000 [IU] via ORAL
  Filled 2015-08-07 (×31): qty 5

## 2015-08-07 MED ORDER — INSULIN GLARGINE 100 UNIT/ML ~~LOC~~ SOLN
10.0000 [IU] | Freq: Every day | SUBCUTANEOUS | Status: DC
  Administered 2015-08-07: 10 [IU] via SUBCUTANEOUS
  Filled 2015-08-07: qty 0.1

## 2015-08-07 NOTE — Progress Notes (Signed)
eLink Physician-Brief Progress Note Patient Name: Zachary Blevins DOB: [birth year] MRN: [id number]   Date of Service  08/07/2015  HPI/Events of Note  Agitation. Already on Fentanyl IV infusion at 400 mcg/hour and Versed IV infusion at 3 mg/hour.  eICU Interventions  Will increase the ceiling of the Versed IV infusion to 8 mg/hour.      Intervention Category Minor Interventions: Agitation / anxiety - evaluation and management  Sommer,Steven [place] 08/07/2015, 2:12 AM

## 2015-08-07 NOTE — Progress Notes (Signed)
PULMONARY / CRITICAL CARE MEDICINE   Name: Zachary Blevins MRN: [id number] DOB: 01/25/1946    ADMISSION DATE:  07/15/2015 CONSULTATION DATE:  07/17/2015  REFERRING MD:  [facility]   CHIEF COMPLAINT:  Acute Respiratory Failure  SUBJECTIVE:  RN reports pt vomited last pm, OGT noted to be coiled in mouth - pulled and replaced.  Pt suctioned, no evidence of TF from ETT.  No other acute events.  Net neg 1L in last 24 hours, 2.7L since admit.  Family reports the patient can not read or write.    REVIEW OF SYSTEMS:  Unable to obtain given intubation & sedation.   VITAL SIGNS: BP 116/67 mmHg  Pulse 74  Temp(Src) 100.2 F (37.9 C) (Oral)  Resp 18  Ht 5\' 11"  (1.803 m)  Wt 300 lb 14.9 oz (136.5 kg)  BMI 41.99 kg/m2  SpO2 96%  HEMODYNAMICS:    VENTILATOR SETTINGS: Vent Mode:  [-] PRVC FiO2 (%):  [60 %-70 %] 60 % Set Rate:  [18 bmp] 18 bmp Vt Set:  [600 mL] 600 mL PEEP:  [16 cmH20] 16 cmH20 Plateau Pressure:  [23 cmH20-29 cmH20] 29 cmH20  INTAKE / OUTPUT: I/O last 3 completed shifts: In: 3404.7 [P.O.:30; I.V.:1776.7; Other:80; NG/GT:1518] Out: 4900 [Urine:4900]  PHYSICAL EXAMINATION: General:  Obese male in NAD on vent  Neuro:  Sedate, periods of intermittent agitation  HEENT:  ETT, mm pink/moist, scleral edema, left scleral hemorrhage  Cardiovascular:  s1s2 distant, regular on monitor Lungs:  Even/non-labored on vent, coarse bilaterally  Abdomen:  Obese/protrubant, soft, BSx4 active  Musculoskeletal:  No acute deformities  Skin:  Warm/dry, no edema, multiple scattered old scars on BLE's   LABS:  BMET  Recent Labs Lab 08/06/15 0448 08/06/15 1648 08/07/15 0332  NA 141 142 144  K 3.8 3.7 3.8  CL 101 100* 101  CO2 31 33* 33*  BUN 43* 47* 42*  CREATININE 1.26* 1.13 1.13  GLUCOSE 130* 219* 168*    Electrolytes  Recent Labs Lab 08/06/15 0448 08/06/15 1648 08/07/15 0332  CALCIUM 7.7* 7.9* 8.0*  MG 2.4  --   --   PHOS 5.0*  --   --     CBC  Recent  Labs Lab 08/06/15 0448 08/07/15 0332  WBC 11.8* 12.9*  HGB 16.9 16.9  HCT 53.1* 53.2*  PLT 143* 143*    Coag's No results for input(s): APTT, INR in the last 168 hours.  Sepsis Markers  Recent Labs Lab 07/26/2015 1719 08/06/15 0448 08/07/15 0332  LATICACIDVEN 1.9  --   --   PROCALCITON <0.10 <0.10 0.13    ABG  Recent Labs Lab 07/21/2015 1827 08/06/15 0345  PHART 7.389 7.419  PCO2ART 49.2* 48.8*  PO2ART 66.0* 85.6    Liver Enzymes  Recent Labs Lab 08/06/15 0448 08/07/15 0332  AST 143* 41  ALT 199* 164*  ALKPHOS 79 86  BILITOT 1.0 1.4*  ALBUMIN 2.8* 2.9*    Cardiac Enzymes  Recent Labs Lab 07/12/2015 1719 07/18/2015 2304 08/06/15 0448  TROPONINI 0.06* 0.06* 0.06*    Glucose  Recent Labs Lab 08/06/15 1207 08/06/15 1629 08/06/15 1922 08/06/15 2329 08/07/15 0338 08/07/15 0748  GLUCAP 213* 225* 221* 184* 155* 165*    Imaging - IMAGES PERSONALLY REVIEWED Dg Chest Port 1 View  08/07/2015  CLINICAL DATA:  CHF. EXAM: PORTABLE CHEST 1 VIEW COMPARISON:  Yesterday. FINDINGS: The endotracheal tube in satisfactory position. Nasogastric tube extending into the stomach. Stable enlarged cardiac silhouette and left basilar pleural fluid and  airspace opacity. Increased right pleural fluid and right basilar airspace opacity. Decreased prominence of the pulmonary vasculature. Stable prominence of the interstitial markings. Mild scoliosis. IMPRESSION: 1. Stable dense left lower lobe atelectasis or pneumonia and a small left pleural effusion. 2. Increased right basilar atelectasis or pneumonia and a small right pleural effusion. 3. Pulmonary vascular congestion with improvement. 4. Stable mild chronic interstitial lung disease. Electronically Signed   By: Beckie Salts M.D.   On: 08/07/2015 07:47   Dg Abd Portable 1v  08/07/2015  CLINICAL DATA:  OG tube placement. EXAM: PORTABLE ABDOMEN - 1 VIEW COMPARISON:  Yesterday FINDINGS: Tip and side port of the enteric tube below  the diaphragm in the stomach. No bowel dilatation in the upper abdomen. IMPRESSION: Tip and side port of the enteric tube in the stomach. Electronically Signed   By: Rubye Oaks M.D.   On: 08/07/2015 06:20   Dg Abd Portable 1v  08/06/2015  CLINICAL DATA:  Nasogastric catheter placement EXAM: PORTABLE ABDOMEN - 1 VIEW COMPARISON:  None. FINDINGS: Scattered large and small bowel gas is noted. A gastric catheter is noted within the stomach. The proximal side port is just beyond the gastroesophageal junction. Mild degenerative changes of the lumbar spine are seen. IMPRESSION: Gastric catheter as described. Electronically Signed   By: Alcide Clever M.D.   On: 08/06/2015 18:14     STUDIES:  CT Chest 5/26 (OSH) >> no evidence of PE, extensive atx of LUL, lingula, bilateral lower lobes, small bilateral effusions, 4.5 cm ascending thoracic aortic aneurysm recommended for semi-annual follow up with Thoracic Surgery ECHO 5/26 >> LVEF 60-65%, no wall motion, grade 1 diastolic dysfunction, mild aortic root dilation RUQ [place] 5/28 >>   CULTURES: OSH  UC 5/22 >> ngtd >> BCx2 5/21 >>  Tracheal Aspirate 5/21 >> normal flora   BCx2 5/26 >>  Tracheal Aspirate 5/26 >>  U. Strep 5/26 >> negative U. Legionella 5/26 >>   ANTIBIOTICS: None.  SIGNIFICANT EVENTS: 5/21  Admit to [facility] with respiratory distress, intubated, thought CAP, +/- CHF   LINES/TUBES: ETT 5/21 >> exchanged 5/26 >>  PIV  DISCUSSION: 70 y/o M with PMH of DM, HTN, COPD and recent admit for CAP admitted 5/21 to [facility] with acute respiratory failure.  Progressed to hypercarbic failure and intubated.  Tx to [facility] 5/26.  Concern for possible HCAP with recent admit in March 2017 and CHF exacerbation with layering pleural effusions.   ASSESSMENT / PLAN:  PULMONARY A: Acute Hypoxemic Respiratory Failure - suspect related to CHF given no real response to ABX CAP  Bilateral Atelectasis / Mucus Plugging Bilateral  Pleural Effusions Suspected COPD  ? Aspiration Event - pm 5/27 with vomiting / OGT coiled in mouth P:   PRVC 8cc/kg Wean FiO2 for sats > 92% Keep PEEP 16 until O2 weaned to 40%, then begin weaning PEEP Intermittent CXR > monitor for development of airspace disease / aspiration Scheduled duonebs Q6 + PRN albuterol  Repeat cultures as above No indication for abx    CARDIOVASCULAR A:  CHF exacerbation  Hx HTN, HLD Atherosclerotic Coronary Disease - noted on CT Mild Elevation of Troponin - likely demand ischemia  Ascending Thoracic Aneurysm - noted on 5/26 CT at [place].   P:  Continue Lasix gtt @  /hr, add end time for 12 more hours (2000 5/28 pm) Trend troponin, BNP trend  ICU monitoring   RENAL A:   At Risk for AKI - in setting of contrast administration  for CTA chest + lasix gtt P:   Trend UOP  BMP in am  Replace electrolytes as indicated  NS @ KVO   GASTROINTESTINAL A:   Morbid Obesity  Elevated LFT's - ? Congestive hepatopathy with CHF P:   Continue NGT Continue TF PPI for SUP  Trend LFT's   HEMATOLOGIC A:   Mild Anemia - no evidence of acute bleeding  Mild Thrombocytopenia  P:  Heparin for DVT prophylaxis  Trend CBC   INFECTIOUS A:   R/o PNA - doubt infectious process, concern for CHF  P:   Repeat cultures  [facility]   ENDOCRINE A:   DM II  - Blood glucose uncontrolled. P:   CBG Q4 with SSI  Lantus 10 units QHS  NEUROLOGIC A:   Acute Encephalopathy - in setting of respiratory failure, hypercarbia  P:   RASS goal: -1 Fentanyl gtt for pain / sedation  Versed gtt for sedation, failed intermittent pushes   FAMILY  - Updates: No family available am 5/28.  Family updated pm 5/27.      - Inter-disciplinary family meet or Palliative Care meeting due by:  6/1    Canary BrimBrandi Ollis, NP-C [facility] Pgr: [phone] or if no answer [phone] 08/07/2015, 8:07 AM   PCCM Attending Note: Patient seen and  examined with nurse practitioner. Please refer to progress note which I reviewed in detail. Lung compliance remains excellent on ventilator and he is weaned successfully to 0.4 FiO2. Transitioning to intermittent Lasix off of Lasix infusion this evening. Patient has started to feedings and has subsequently developed rising glucose levels. We are increasing subcutaneous insulin in an effort to further control this. Right upper quadrant ultrasound shows no evidence of cholecystitis or common bile duct dilatation but does suggest early hepatic cirrhosis. Attempting to minimize sedation at this time. Patient may be a good candidate for Precedex infusion. Starting Soquel daily at bedtime.  I spent a total of 34 minutes of critical care time today caring for the patient, updating the patient's family, and reviewing the patient's electronic medical record.  Donna ChristenJennings E. Jamison NeighborNestor, M.D. [facility] Pager:  [phone] After 3pm or if no response, call [phone] 5:45 PM 08/07/2015

## 2015-08-07 NOTE — Progress Notes (Signed)
Pt appeared to vomit at 2100, tube feeding held, pt suctioned orally and through ETT. Pt had copious white secretions from mouth but none from ETT. Air bolus heard when checked for positioning. OG placed to low intermittent suction.  At 2200 tube feedings restarted. At 2335 ETT noted to be most of the way out. Tube feedings stopped, pt suctioned well, OG pulled the rest of the way out. Pt was to be NPO at midnight. Pt didn't show any signs of respiratory distress or fighting the ventilator during either event.  At 0400, New OG placed. Air bolus present. KUB ordered for placement. OG now clamped. Will continue to monitor.

## 2015-08-08 ENCOUNTER — Inpatient Hospital Stay (HOSPITAL_COMMUNITY): Payer: Medicare Other

## 2015-08-08 DIAGNOSIS — R509 Fever, unspecified: Secondary | ICD-10-CM

## 2015-08-08 LAB — CBC
HEMATOCRIT: 49.3 % (ref 39.0–52.0)
HEMOGLOBIN: 14.9 g/dL (ref 13.0–17.0)
MCH: 28 pg (ref 26.0–34.0)
MCHC: 30.2 g/dL (ref 30.0–36.0)
MCV: 92.5 fL (ref 78.0–100.0)
PLATELETS: 189 10*3/uL (ref 150–400)
RBC: 5.33 MIL/uL (ref 4.22–5.81)
RDW: 14.1 % (ref 11.5–15.5)
WBC: 12 10*3/uL — AB (ref 4.0–10.5)

## 2015-08-08 LAB — BASIC METABOLIC PANEL
ANION GAP: 8 (ref 5–15)
BUN: 52 mg/dL — ABNORMAL HIGH (ref 6–20)
CHLORIDE: 101 mmol/L (ref 101–111)
CO2: 34 mmol/L — ABNORMAL HIGH (ref 22–32)
Calcium: 8 mg/dL — ABNORMAL LOW (ref 8.9–10.3)
Creatinine, Ser: 1.24 mg/dL (ref 0.61–1.24)
GFR calc non Af Amer: 57 mL/min — ABNORMAL LOW (ref >60)
Glucose, Bld: 276 mg/dL — ABNORMAL HIGH (ref 65–99)
POTASSIUM: 4 mmol/L (ref 3.5–5.1)
SODIUM: 143 mmol/L (ref 135–145)

## 2015-08-08 LAB — GLUCOSE, CAPILLARY
GLUCOSE-CAPILLARY: 278 mg/dL — AB (ref 65–99)
GLUCOSE-CAPILLARY: 356 mg/dL — AB (ref 65–99)
Glucose-Capillary: 259 mg/dL — ABNORMAL HIGH (ref 65–99)
Glucose-Capillary: 275 mg/dL — ABNORMAL HIGH (ref 65–99)
Glucose-Capillary: 281 mg/dL — ABNORMAL HIGH (ref 65–99)
Glucose-Capillary: 362 mg/dL — ABNORMAL HIGH (ref 65–99)

## 2015-08-08 LAB — URINE MICROSCOPIC-ADD ON: Squamous Epithelial / LPF: NONE SEEN

## 2015-08-08 LAB — CULTURE, RESPIRATORY W GRAM STAIN: Culture: NORMAL

## 2015-08-08 LAB — URINALYSIS, ROUTINE W REFLEX MICROSCOPIC
BILIRUBIN URINE: NEGATIVE
Glucose, UA: NEGATIVE mg/dL
Ketones, ur: NEGATIVE mg/dL
NITRITE: NEGATIVE
PH: 5 (ref 5.0–8.0)
Protein, ur: NEGATIVE mg/dL
SPECIFIC GRAVITY, URINE: 1.027 (ref 1.005–1.030)

## 2015-08-08 LAB — LEGIONELLA PNEUMOPHILA SEROGP 1 UR AG: L. pneumophila Serogp 1 Ur Ag: NEGATIVE

## 2015-08-08 LAB — CULTURE, RESPIRATORY

## 2015-08-08 LAB — BRAIN NATRIURETIC PEPTIDE: B Natriuretic Peptide: 79.7 pg/mL (ref 0.0–100.0)

## 2015-08-08 MED ORDER — ACETAMINOPHEN 160 MG/5ML PO SOLN
650.0000 mg | ORAL | Status: DC | PRN
  Administered 2015-08-08 – 2015-08-10 (×3): 650 mg via ORAL
  Filled 2015-08-08 (×3): qty 20.3

## 2015-08-08 MED ORDER — INSULIN GLARGINE 100 UNIT/ML ~~LOC~~ SOLN
20.0000 [IU] | Freq: Every day | SUBCUTANEOUS | Status: DC
  Administered 2015-08-08: 20 [IU] via SUBCUTANEOUS
  Filled 2015-08-08 (×2): qty 0.2

## 2015-08-08 NOTE — Clinical Documentation Improvement (Signed)
Critical Care  Can the diagnosis of CHF Exacerbation be further specified? Please document response in next progress note. Thank you!    Acuity - Acute, Chronic, Acute on Chronic   Type - Systolic, Diastolic, Systolic and Diastolic  Other  Clinically Undetermined  Document any associated diagnoses/conditions  Supporting Information:  ECHO reveals: EF of 60-65% with normal systolic function, no regional wall motion abnormalities, Grade 1 diastolic dysfunction  Placed on Lasix drip  Please exercise your independent, professional judgment when responding. A specific answer is not anticipated or expected.  Thank You,  Shellee MiloEileen T Abaigeal Moomaw RN, BSN, CCDS Health Information Management [place] [phone]; Cell: [phone]

## 2015-08-08 NOTE — Progress Notes (Signed)
PULMONARY / CRITICAL CARE MEDICINE   Name: Zachary Blevins MRN: [id number] DOB: June 12, 1945    ADMISSION DATE:  07/22/2015 CONSULTATION DATE:  07/14/2015  REFERRING MD:  [facility]   CHIEF COMPLAINT:  Acute Respiratory Failure  SUBJECTIVE:  RT reports pt saturations remain 95% or > on 40%, 16 PEEP.  No acute events overnight.    REVIEW OF SYSTEMS:  Unable to obtain given intubation & sedation.   VITAL SIGNS: BP 121/51 mmHg  Pulse 80  Temp(Src) 99 F (37.2 C) (Oral)  Resp 18  Ht 5\' 11"  (1.803 m)  Wt 297 lb 9.9 oz (135 kg)  BMI 41.53 kg/m2  SpO2 98%  HEMODYNAMICS:    VENTILATOR SETTINGS: Vent Mode:  [-] PRVC FiO2 (%):  [40 %] 40 % Set Rate:  [18 bmp] 18 bmp Vt Set:  [600 mL] 600 mL PEEP:  [16 cmH20] 16 cmH20 Plateau Pressure:  [27 cmH20-31 cmH20] 29 cmH20  INTAKE / OUTPUT: I/O last 3 completed shifts: In: 4585.8 [I.V.:1981.4; NG/GT:2604.4] Out: 4125 [Urine:4125]  PHYSICAL EXAMINATION: General:  Obese male in NAD on vent  Neuro:  Sedate, periods of intermittent agitation  HEENT:  ETT, mm pink/moist, scleral edema, left scleral hemorrhage  Cardiovascular:  s1s2 distant, regular on monitor Lungs:  Even/non-labored on vent, coarse with rhonchi bilaterally  Abdomen:  Obese/protrubant, soft, BSx4 active  Musculoskeletal:  No acute deformities  Skin:  Warm/dry, no edema, multiple scattered old scars on BLE's     LABS:  BMET  Recent Labs Lab 08/06/15 1648 08/07/15 0332 08/08/15 0241  NA 142 144 143  K 3.7 3.8 4.0  CL 100* 101 101  CO2 33* 33* 34*  BUN 47* 42* 52*  CREATININE 1.13 1.13 1.24  GLUCOSE 219* 168* 276*    Electrolytes  Recent Labs Lab 08/06/15 0448 08/06/15 1648 08/07/15 0332 08/08/15 0241  CALCIUM 7.7* 7.9* 8.0* 8.0*  MG 2.4  --   --   --   PHOS 5.0*  --   --   --     CBC  Recent Labs Lab 08/06/15 0448 08/07/15 0332 08/08/15 0241  WBC 11.8* 12.9* 12.0*  HGB 16.9 16.9 14.9  HCT 53.1* 53.2* 49.3  PLT 143* 143* 189     Coag's No results for input(s): APTT, INR in the last 168 hours.  Sepsis Markers  Recent Labs Lab 07/19/2015 1719 08/06/15 0448 08/07/15 0332  LATICACIDVEN 1.9  --   --   PROCALCITON <0.10 <0.10 0.13    ABG  Recent Labs Lab 07/30/2015 1827 08/06/15 0345  PHART 7.389 7.419  PCO2ART 49.2* 48.8*  PO2ART 66.0* 85.6    Liver Enzymes  Recent Labs Lab 08/06/15 0448 08/07/15 0332  AST 143* 41  ALT 199* 164*  ALKPHOS 79 86  BILITOT 1.0 1.4*  ALBUMIN 2.8* 2.9*    Cardiac Enzymes  Recent Labs Lab 08/03/2015 1719 07/22/2015 2304 08/06/15 0448  TROPONINI 0.06* 0.06* 0.06*    Glucose  Recent Labs Lab 08/07/15 0748 08/07/15 1133 08/07/15 1552 08/07/15 1953 08/07/15 2333 08/08/15 0311  GLUCAP 165* 164* 283* 340* 366* 275*    Imaging - IMAGES PERSONALLY REVIEWED Dg Chest Port 1 View  08/08/2015  CLINICAL DATA:  Check endotracheal tube placement EXAM: PORTABLE CHEST 1 VIEW COMPARISON:  08/07/2015 FINDINGS: Endotracheal tube and nasogastric catheter are again identified and stable. Cardiac shadow is mildly enlarged but stable. The lungs are well aerated bilaterally. Persistent left basilar changes are seen. No new focal abnormality is noted. IMPRESSION: No change  from the prior exam Electronically Signed   By: Alcide Clever M.D.   On: 08/08/2015 07:15   US Abdomen Limited Ruq  08/07/2015  CLINICAL DATA:  70 year old male with elevated LFTs EXAM: US ABDOMEN LIMITED - RIGHT UPPER QUADRANT COMPARISON:  Prior CT abdomen/ pelvis 06/27/2010; prior abdominal ultrasound 05/29/2010 FINDINGS: Gallbladder: Surgically absent. Common bile duct: Diameter: Within normal limits at 5 mm. Liver: Heterogeneous liver with coarsening of the parenchymal echotexture. Additionally, the adjacent renal parenchyma appears hypoechoic in comparison. These findings are consistent with at least moderate hepatic steatosis. Furthermore, the liver has a mildly nodular contour with blunting of the left  liver edge suggesting cirrhosis. The main portal vein remains patent with normal hepatopetal flow. Other: Small right pleural effusion. IMPRESSION: 1. Hepatic steatosis with early morphologic changes of cirrhosis. 2. The main portal vein is patent with normal hepatopetal directional flow. 3. Surgical changes of prior cholecystectomy. No biliary ductal dilatation. 4. Small right pleural effusion. Electronically Signed   By: Malachy Moan M.D.   On: 08/07/2015 09:39     STUDIES:  CT Chest 5/26 (OSH) >> no evidence of PE, extensive atx of LUL, lingula, bilateral lower lobes, small bilateral effusions, 4.5 cm ascending thoracic aortic aneurysm recommended for semi-annual follow up with Thoracic Surgery ECHO 5/26 >> LVEF 60-65%, no wall motion, grade 1 diastolic dysfunction, mild aortic root dilation RUQ [place] 5/28 >>   CULTURES: OSH  UC 5/22 >> ngtd >> BCx2 5/21 >>  Tracheal Aspirate 5/21 >> normal flora   BCx2 5/26 >>  Tracheal Aspirate 5/26 >>  U. Strep 5/26 >> negative U. Legionella 5/26 >>   ANTIBIOTICS: None.  SIGNIFICANT EVENTS: 5/21  Admit to [facility] with respiratory distress, intubated, thought CAP, +/- CHF   LINES/TUBES: ETT 5/21 >> exchanged 5/26 >>  PIV  DISCUSSION: 70 y/o M with PMH of DM, HTN, COPD and recent admit for CAP admitted 5/21 to [facility] with acute respiratory failure.  Progressed to hypercarbic failure and intubated.  Tx to [facility] 5/26.  Concern for possible HCAP with recent admit in March 2017 and CHF exacerbation with layering pleural effusions.   ASSESSMENT / PLAN:  PULMONARY A: Acute Hypoxemic Respiratory Failure - suspect related to CHF given no real response to ABX CAP  Bilateral Atelectasis / Mucus Plugging Bilateral Pleural Effusions Suspected COPD  ? Aspiration Event - pm 5/27 with vomiting / OGT coiled in mouth P:   PRVC 8cc/kg Wean FiO2 for sats > 92% Continue PEEP to 16 > attempted wean to 14 on 5/29 & pt desaturated to  88% at rest / without agitation Intermittent CXR > monitor for development of airspace disease / aspiration Scheduled duonebs Q6 + PRN albuterol  Repeat cultures as above No indication for abx    CARDIOVASCULAR A:  CHF exacerbation  Hx HTN, HLD Atherosclerotic Coronary Disease - noted on CT Mild Elevation of Troponin - likely demand ischemia  Ascending Thoracic Aneurysm - noted on 5/26 CT at [place].   P:  Consider lasix on daily rounds, gtt d/c'd pm 5/28 Trend troponin, BNP trend  ICU monitoring   RENAL A:   At Risk for AKI - in setting of contrast administration for CTA chest + lasix gtt P:   Trend UOP / BMP Replace electrolytes as indicated  NS @ KVO   GASTROINTESTINAL A:   Morbid Obesity  Elevated LFT's - ? Congestive hepatopathy with CHF P:   Continue NGT Continue TF PPI for SUP  Trend LFT's  HEMATOLOGIC A:   Mild Anemia - no evidence of acute bleeding  Mild Thrombocytopenia  P:  Heparin for DVT prophylaxis  Trend CBC   INFECTIOUS A:   R/o PNA - doubt infectious process, concern for CHF  P:   Repeat cultures  [facility]   ENDOCRINE A:   DM II  - Blood glucose uncontrolled. P:   CBG Q4 with SSI  Increase Lantus to 20 units QHS  NEUROLOGIC A:   Acute Encephalopathy - in setting of respiratory failure, hypercarbia  P:   RASS goal: -1 Fentanyl gtt for pain / sedation  Versed gtt for sedation, failed intermittent pushes   FAMILY  - Updates:  Family  Updated pm 5/28, none available am 5/29  - Inter-disciplinary family meet or Palliative Care meeting due by:  6/1    Canary BrimBrandi Ollis, NP-C [facility] Pgr: [phone] or if no answer [phone] 08/08/2015, 7:42 AM  PCCM Attending Note: Patient seen and examined with nurse practitioner. Please refer to progress note which I reviewed in detail. Lung compliance remains excellent on ventilator and he is weaned successfully to 0.4 FiO2.  Still requiring high PEEP  for recruitment. Continuing diuresis with intermittent lasix & re-consider in AM. Increasing Lantus for control of CBG. Plan to re-culture for a fever.   I spent a total of 32 minutes of critical care time today caring for the patient and reviewing the patient's electronic medical record.  Donna ChristenJennings E. Jamison NeighborNestor, M.D. [facility] Pager:  [phone] After 3pm or if no response, call [phone] 3:55 PM 08/08/2015

## 2015-08-08 NOTE — Progress Notes (Signed)
Notified Brandi, Ollis,N.P. At 1250 regarding elevated temperature 101.7.  Tylenol given per order.  Cultures still pending.  Will continue to monitor for any  further changes.

## 2015-08-08 NOTE — Clinical Documentation Improvement (Signed)
Critical Care  Can the diagnosis of "mild anemia" be further specified? Please document response in next progress note. Thank you!   Iron deficiency Anemia  Nutritional anemia, including the nutrition or mineral deficits  Chronic Blood Loss Anemia, including the suspected or known cause  Anemia of chronic disease, including the associated chronic disease state  Other  Clinically Undetermined  Document any associated diagnoses/conditions.  Supporting Information:  HGB's running 16.9, 14.9  Please exercise your independent, professional judgment when responding. A specific answer is not anticipated or expected.  Thank You,  Shellee MiloEileen T Tiffinie Caillier RN, BSN, CCDS Health Information Management [place] [phone]; Cell: [phone]

## 2015-08-08 NOTE — Clinical Documentation Improvement (Signed)
Critical Care  Please clarify what you mean by "Aspiration Event" and document any associated diagnoses/conditions the patient has or may have in next progress note. Thank you!.   Aspiration Pneumonia  Choked on OJT - pulled; resolved  Other  Clinically Undetermined  Supporting Information:  No antibiotics initiated  CXR with increased right basilar atelectasis or pneumonia and a small right pleural effusion noted  Please exercise your independent, professional judgment when responding. A specific answer is not anticipated or expected.  Thank You, Shellee MiloEileen T Aiden Rao RN, BSN, CCDS Health Information Management  [phone]; Cell: [phone]

## 2015-08-09 ENCOUNTER — Inpatient Hospital Stay (HOSPITAL_COMMUNITY): Payer: Medicare Other

## 2015-08-09 DIAGNOSIS — K5909 Other constipation: Secondary | ICD-10-CM

## 2015-08-09 LAB — URINALYSIS W MICROSCOPIC (NOT AT ARMC)
BILIRUBIN URINE: NEGATIVE
GLUCOSE, UA: NEGATIVE mg/dL
Hgb urine dipstick: NEGATIVE
KETONES UR: NEGATIVE mg/dL
NITRITE: NEGATIVE
PH: 5 (ref 5.0–8.0)
PROTEIN: NEGATIVE mg/dL
Specific Gravity, Urine: 1.02 (ref 1.005–1.030)

## 2015-08-09 LAB — CBC
HCT: 45.6 % (ref 39.0–52.0)
HEMOGLOBIN: 13.4 g/dL (ref 13.0–17.0)
MCH: 28.2 pg (ref 26.0–34.0)
MCHC: 29.4 g/dL — AB (ref 30.0–36.0)
MCV: 95.8 fL (ref 78.0–100.0)
Platelets: 176 10*3/uL (ref 150–400)
RBC: 4.76 MIL/uL (ref 4.22–5.81)
RDW: 14.3 % (ref 11.5–15.5)
WBC: 13.1 10*3/uL — AB (ref 4.0–10.5)

## 2015-08-09 LAB — BASIC METABOLIC PANEL
ANION GAP: 6 (ref 5–15)
BUN: 56 mg/dL — ABNORMAL HIGH (ref 6–20)
CALCIUM: 7.7 mg/dL — AB (ref 8.9–10.3)
CO2: 32 mmol/L (ref 22–32)
Chloride: 106 mmol/L (ref 101–111)
Creatinine, Ser: 1.16 mg/dL (ref 0.61–1.24)
Glucose, Bld: 315 mg/dL — ABNORMAL HIGH (ref 65–99)
Potassium: 5 mmol/L (ref 3.5–5.1)
SODIUM: 144 mmol/L (ref 135–145)

## 2015-08-09 LAB — GLUCOSE, CAPILLARY
GLUCOSE-CAPILLARY: 288 mg/dL — AB (ref 65–99)
GLUCOSE-CAPILLARY: 318 mg/dL — AB (ref 65–99)
GLUCOSE-CAPILLARY: 292 mg/dL — AB (ref 65–99)
Glucose-Capillary: 258 mg/dL — ABNORMAL HIGH (ref 65–99)
Glucose-Capillary: 291 mg/dL — ABNORMAL HIGH (ref 65–99)

## 2015-08-09 LAB — PROCALCITONIN: Procalcitonin: 0.21 ng/mL

## 2015-08-09 MED ORDER — SENNOSIDES 8.8 MG/5ML PO SYRP
5.0000 mL | ORAL_SOLUTION | Freq: Two times a day (BID) | ORAL | Status: DC
  Administered 2015-08-09 – 2015-08-13 (×10): 5 mL
  Filled 2015-08-09 (×11): qty 5

## 2015-08-09 MED ORDER — FUROSEMIDE 10 MG/ML IJ SOLN
40.0000 mg | Freq: Once | INTRAMUSCULAR | Status: AC
  Administered 2015-08-09: 40 mg via INTRAVENOUS
  Filled 2015-08-09: qty 4

## 2015-08-09 MED ORDER — INSULIN NPH (HUMAN) (ISOPHANE) 100 UNIT/ML ~~LOC~~ SUSP
30.0000 [IU] | Freq: Four times a day (QID) | SUBCUTANEOUS | Status: DC
  Administered 2015-08-09 – 2015-08-10 (×4): 30 [IU] via SUBCUTANEOUS
  Filled 2015-08-09: qty 10

## 2015-08-09 MED ORDER — PANTOPRAZOLE SODIUM 40 MG PO PACK
40.0000 mg | PACK | Freq: Every day | ORAL | Status: DC
  Administered 2015-08-09 – 2015-08-15 (×7): 40 mg
  Filled 2015-08-09 (×6): qty 20

## 2015-08-09 MED ORDER — SODIUM CHLORIDE 0.9 % IV BOLUS (SEPSIS)
1000.0000 mL | Freq: Once | INTRAVENOUS | Status: AC
  Administered 2015-08-09: 1000 mL via INTRAVENOUS

## 2015-08-09 NOTE — Progress Notes (Signed)
eLink Physician-Brief Progress Note Patient Name: Zollie BeckersRoy N Mcneice DOB: 17-Aug-1945 MRN: [id number]   Date of Service  08/09/2015  HPI/Events of Note  Sinus Tachycardia - HR = 120. Temp = 99.9 F.  eICU Interventions  Will order: 1. 0.9 NaCl 1 liter IV over 1 hour now.       Intervention Category Major Interventions: Arrhythmia - evaluation and management  Nicholus Chandran Eugene 08/09/2015, 1:55 AM

## 2015-08-09 NOTE — Care Management Important Message (Signed)
Important Message  Patient Details  Name: Zachary Blevins MRN: [id number] Date of Birth: 12-07-1945   Medicare Important Message Given:  Yes    Bernadette HoitShoffner, Zanetta Dehaan Coleman 08/09/2015, 7:55 AM

## 2015-08-09 NOTE — Progress Notes (Signed)
Transferred to 2M16 w/Respiratory Therapy d/t ventilator support.  Spoke with Deanna (Dgt.) to inform of transfer to new unit.

## 2015-08-09 NOTE — Progress Notes (Signed)
PULMONARY / CRITICAL CARE MEDICINE   Name: Zachary Blevins MRN: [id number] DOB: [birth year]    ADMISSION DATE:  07/23/2015 CONSULTATION DATE:  08/07/2015  REFERRING MD:  [facility]   CHIEF COMPLAINT:  Acute Respiratory Failure   SUBJECTIVE:  Patient still having low-grade fevers overnight. Patient was also tachycardic overnight w/ borderline hypotension prompting 1L NS bolus.  REVIEW OF SYSTEMS:  Unable to obtain given intubation & sedation.  REVIEW OF SYSTEMS:  Unable to obtain given intubation & sedation.   VITAL SIGNS: BP 124/54 mmHg  Pulse 74  Temp(Src) 99.7 F (37.6 C) (Oral)  Resp 18  Ht 5\' 11"  (1.803 m)  Wt 300 lb 4.3 oz (136.2 kg)  BMI 41.90 kg/m2  SpO2 97%  HEMODYNAMICS:    VENTILATOR SETTINGS: Vent Mode:  [-] PRVC FiO2 (%):  [40 %] 40 % Set Rate:  [18 bmp] 18 bmp Vt Set:  [600 mL] 600 mL PEEP:  [16 cmH20] 16 cmH20 Plateau Pressure:  [28 cmH20-33 cmH20] 33 cmH20  INTAKE / OUTPUT: I/O last 3 completed shifts: In: 5455.9 [I.V.:1754.2; NG/GT:2985; IV Piggyback:716.7] Out: 3325 [Urine:3325]  PHYSICAL EXAMINATION: General:  Morbidly obese. No distress. Daughter at bedside. Neuro:  Sedated. Spontaneously moves extremities & opens eyes with stimulation. Not following commands. HEENT:  Endotracheal tube in place. Bilateral scleral injection. Cardiovascular:  Regular rate & rhythm. No appreciable JVD given body habitus. Lungs:  CTAB. Symmetric chest rise on ventilator. Abdomen:  Protuberant. Soft. Hypoactive BS. Integument:  Warm & dry. No rash on exposed skin.  LABS:  BMET  Recent Labs Lab 08/07/15 0332 08/08/15 0241 08/09/15 0323  NA 144 143 144  K 3.8 4.0 5.0  CL 101 101 106  CO2 33* 34* 32  BUN 42* 52* 56*  CREATININE 1.13 1.24 1.16  GLUCOSE 168* 276* 315*    Electrolytes  Recent Labs Lab 08/06/15 0448  08/07/15 0332 08/08/15 0241 08/09/15 0323  CALCIUM 7.7*  < > 8.0* 8.0* 7.7*  MG 2.4  --   --   --   --   PHOS 5.0*  --   --   --    --   < > = values in this interval not displayed.  CBC  Recent Labs Lab 08/07/15 0332 08/08/15 0241 08/09/15 0323  WBC 12.9* 12.0* 13.1*  HGB 16.9 14.9 13.4  HCT 53.2* 49.3 45.6  PLT 143* 189 176    Coag's No results for input(s): APTT, INR in the last 168 hours.  Sepsis Markers  Recent Labs Lab 07/19/2015 1719 08/06/15 0448 08/07/15 0332  LATICACIDVEN 1.9  --   --   PROCALCITON <0.10 <0.10 0.13    ABG  Recent Labs Lab 07/12/2015 1827 08/06/15 0345  PHART 7.389 7.419  PCO2ART 49.2* 48.8*  PO2ART 66.0* 85.6    Liver Enzymes  Recent Labs Lab 08/06/15 0448 08/07/15 0332  AST 143* 41  ALT 199* 164*  ALKPHOS 79 86  BILITOT 1.0 1.4*  ALBUMIN 2.8* 2.9*    Cardiac Enzymes  Recent Labs Lab 07/29/2015 1719 08/10/2015 2304 08/06/15 0448  TROPONINI 0.06* 0.06* 0.06*    Glucose  Recent Labs Lab 08/08/15 1233 08/08/15 1622 08/08/15 1935 08/08/15 2349 08/09/15 0406 08/09/15 0834  GLUCAP 278* 281* 356* 362* 288* 258*    Imaging - IMAGES PERSONALLY REVIEWED Dg Chest Port 1 View  08/09/2015  CLINICAL DATA:  Respiratory failure. EXAM: PORTABLE CHEST 1 VIEW COMPARISON:  08/08/2015. FINDINGS: Endotracheal tube in stable position. NG tube appears to be below the  hemidiaphragms however the hemidiaphragms are not completely imaged. Stable cardiomegaly. Bilateral basilar airspace disease again noted. No change from prior exam. No pneumothorax. IMPRESSION: 1. Endotracheal tube in stable position. NG tube appears to be below the left hemidiaphragm however the hemidiaphragms are incompletely imaged. 2. Cardiomegaly. Persistent bibasilar airspace disease. No interim change. Electronically Signed   By: Maisie Fus  Register   On: 08/09/2015 07:10    STUDIES:  CT Chest 5/26 (OSH): no evidence of PE, extensive atx of LUL, lingula, bilateral lower lobes, small bilateral effusions, 4.5 cm ascending thoracic aortic aneurysm recommended for semi-annual follow up with Thoracic  Surgery ECHO 5/26: LVEF 60-65%, no wall motion, grade 1 diastolic dysfunction, mild aortic root dilation RUQ [place] 5/28: Hepatic steatosis w/ patent porta vein & normal flow. No biliary ductal dilation.  CULTURES: OSH  UC 5/22:  Negative BCx2 5/21:  Negative  Tracheal Aspirate 5/21: Normal Oral Flora  BCx2 5/26 >>  Tracheal Aspirate 5/26:  Oral Flora  U. Strep 5/26:  Negative U. Legionella 5/26:  Negative   UrC 5/29>> BC x2 5/30>> Trach Asp 5/30>>  ANTIBIOTICS: None.  SIGNIFICANT EVENTS: 5/21  Admit to [facility] with respiratory distress, intubated, thought CAP, +/- CHF   LINES/TUBES: ETT 5/21 >> exchanged 5/26 >>  OGT >> Foley >> PIV x2  ASSESSMENT / PLAN:  PULMONARY A: Acute Hypoxemic Respiratory Failure - Suspect pulmonary edema from diastolic CHF. CAP - Unlikely. Cultures negative so far. Bilateral Pleural Effusions Suspected COPD  Possible Aspiration Event - pm 5/27 with vomiting / OGT coiled in mouth  P:   Full Vent Support w/ PRVC 8cc/kg Wean FiO2 for sats > 94% Continue PEEP at 16 - Derecruits easily Duoneb q6hr  CARDIOVASCULAR A:  Acute Diastolic CHF  H/O HTN & HLD Elevated Troponin I - Likely demand ischemia. Atherosclerotic CAD - Noted on CT scan. Ascending Thoracic Aneurysm - Noted on 5/26 CT at [facility].    P:  Monitoring patient on telemetry Vitals per unit protocol Intermittent Lasix IV diuresis EKG daily to monitor QTc daily on Seroquel  RENAL A:   No acute issues.  P:   Trending UOP with Foley Monitoring electrolytes & renal function daily Replace electrolytes as indicated  NS @ KVO   GASTROINTESTINAL A:   Morbid Obesity  Transaminitis - Likely congestive hepatopathy w/ CHF.  Hepatic Steatosis/Early Cirrhosis - Seen on RUQ U/S. Prior h/o EtOH stopped 1990s. No BM  P:   Continue TF Protonix VT daily Senna VT bid  HEMATOLOGIC A:   Leukocytosis - Mild. Possible SIRS. Anemia - Resolved. Thrombocytopenia -  Resolved.  P:  Trending cell counts daily w/ CBC Heparin [place] q8hr SCDs  INFECTIOUS A:   FUO  P:   Following cultures to completion.  PCT per algorithm Repeat Blood & Tracheal Aspirate Cultures today Checking UA w/ micro  ENDOCRINE A:   DM II  - Blood glucose uncontrolled.  P:   Accu-Checks q4hr SSI per Resistant Algorithm D/C Lantus NPH 30u [place] q6hr starting noon  NEUROLOGIC A:   Acute Encephalopathy - Multifactorial w/ hypoxia.  P:   RASS goal: -1 Fentanyl gtt & IV prn Versed gtt & IV prn Seroquel qhs  FAMILY  - Updates:  Family updated at bedside 5/30 by Dr. Jamison Neighbor.   - Inter-disciplinary family meet or Palliative Care meeting due by:  6/1   TODAY'S SUMMARY:  70 y/o M with PMH of DM, HTN, COPD and recent admit for CAP admitted 5/21 to [facility] with  acute respiratory failure.  Progressed to hypercarbic failure and intubated.  [facility]. Improving oxygenation w/ diuresis. Now new fevers & checking cultures while holding on antibiotics. Using Lasix intermittently for diuresis. Increasing insulin for glucose control & increasing Senna to bid for bowel movement.   I spent a total of 36 minutes of critical care time today caring for the patient, updating patient's family, and reviewing the patient's electronic medical record.  Donna ChristenJennings E. Jamison NeighborNestor, M.D. [facility] Pager:  [phone] After 3pm or if no response, call [phone] 10:33 AM 08/09/2015

## 2015-08-09 NOTE — Progress Notes (Signed)
Pt. HR went into upper 130s for about five minutes and is now sustained in the 120s. EKG shows sinus tach.  Temp. 99.9.  MD made aware.   Liter bolus 0.9% NS ordered.  Will give and continue to monitor.

## 2015-08-09 NOTE — Progress Notes (Signed)
Inpatient Diabetes Program Recommendations  AACE/ADA: New Consensus Statement on Inpatient Glycemic Control (2015)  Target Ranges:  Prepandial:   less than 140 mg/dL      Peak postprandial:   less than 180 mg/dL (1-2 hours)      Critically ill patients:  140 - 180 mg/dL   Review of Glycemic Control  Inpatient Diabetes Program Recommendations:  Insulin - Basal: Increase Lantus to 20 units BID Insulin - Meal Coverage: add Novolog tube feed coverage 4 units Q4. Thank you  Piedad ClimesGina Amesha Bailey BSN, RN,CDE Inpatient Diabetes Coordinator [phone] (team pager)

## 2015-08-10 DIAGNOSIS — R945 Abnormal results of liver function studies: Secondary | ICD-10-CM

## 2015-08-10 DIAGNOSIS — J9600 Acute respiratory failure, unspecified whether with hypoxia or hypercapnia: Secondary | ICD-10-CM

## 2015-08-10 DIAGNOSIS — J9601 Acute respiratory failure with hypoxia: Secondary | ICD-10-CM | POA: Insufficient documentation

## 2015-08-10 DIAGNOSIS — Z4659 Encounter for fitting and adjustment of other gastrointestinal appliance and device: Secondary | ICD-10-CM | POA: Insufficient documentation

## 2015-08-10 DIAGNOSIS — I5020 Unspecified systolic (congestive) heart failure: Secondary | ICD-10-CM

## 2015-08-10 DIAGNOSIS — I509 Heart failure, unspecified: Secondary | ICD-10-CM | POA: Insufficient documentation

## 2015-08-10 DIAGNOSIS — R7989 Other specified abnormal findings of blood chemistry: Secondary | ICD-10-CM | POA: Insufficient documentation

## 2015-08-10 LAB — COMPREHENSIVE METABOLIC PANEL
ALBUMIN: 2.3 g/dL — AB (ref 3.5–5.0)
ALT: 55 U/L (ref 17–63)
ANION GAP: 7 (ref 5–15)
AST: 31 U/L (ref 15–41)
Alkaline Phosphatase: 97 U/L (ref 38–126)
BUN: 50 mg/dL — AB (ref 6–20)
CHLORIDE: 106 mmol/L (ref 101–111)
CO2: 34 mmol/L — AB (ref 22–32)
Calcium: 8.2 mg/dL — ABNORMAL LOW (ref 8.9–10.3)
Creatinine, Ser: 1.14 mg/dL (ref 0.61–1.24)
GFR calc Af Amer: >60 mL/min (ref >60)
GFR calc non Af Amer: >60 mL/min (ref >60)
GLUCOSE: 300 mg/dL — AB (ref 65–99)
POTASSIUM: 4.6 mmol/L (ref 3.5–5.1)
SODIUM: 147 mmol/L — AB (ref 135–145)
TOTAL PROTEIN: 5.5 g/dL — AB (ref 6.5–8.1)
Total Bilirubin: 0.9 mg/dL (ref 0.3–1.2)

## 2015-08-10 LAB — CULTURE, BLOOD (ROUTINE X 2)
CULTURE: NO GROWTH
CULTURE: NO GROWTH

## 2015-08-10 LAB — CBC WITH DIFFERENTIAL/PLATELET
Basophils Absolute: 0 10*3/uL (ref 0.0–0.1)
Basophils Relative: 0 %
EOS ABS: 0.4 10*3/uL (ref 0.0–0.7)
Eosinophils Relative: 3 %
HEMATOCRIT: 45.7 % (ref 39.0–52.0)
HEMOGLOBIN: 13.6 g/dL (ref 13.0–17.0)
LYMPHS ABS: 1.4 10*3/uL (ref 0.7–4.0)
LYMPHS PCT: 11 %
MCH: 28.3 pg (ref 26.0–34.0)
MCHC: 29.8 g/dL — ABNORMAL LOW (ref 30.0–36.0)
MCV: 95 fL (ref 78.0–100.0)
MONOS PCT: 15 %
Monocytes Absolute: 1.8 10*3/uL — ABNORMAL HIGH (ref 0.1–1.0)
NEUTROS ABS: 9 10*3/uL — AB (ref 1.7–7.7)
NEUTROS PCT: 71 %
Platelets: 209 10*3/uL (ref 150–400)
RBC: 4.81 MIL/uL (ref 4.22–5.81)
RDW: 14.4 % (ref 11.5–15.5)
WBC: 12.6 10*3/uL — AB (ref 4.0–10.5)

## 2015-08-10 LAB — BLOOD CULTURE ID PANEL (REFLEXED)
ACINETOBACTER BAUMANNII: NOT DETECTED
CANDIDA KRUSEI: NOT DETECTED
CANDIDA PARAPSILOSIS: NOT DETECTED
CARBAPENEM RESISTANCE: NOT DETECTED
Candida albicans: NOT DETECTED
Candida glabrata: NOT DETECTED
Candida tropicalis: NOT DETECTED
ENTEROCOCCUS SPECIES: NOT DETECTED
Enterobacter cloacae complex: NOT DETECTED
Enterobacteriaceae species: NOT DETECTED
Escherichia coli: NOT DETECTED
Haemophilus influenzae: NOT DETECTED
KLEBSIELLA OXYTOCA: NOT DETECTED
KLEBSIELLA PNEUMONIAE: NOT DETECTED
LISTERIA MONOCYTOGENES: NOT DETECTED
Methicillin resistance: DETECTED — AB
NEISSERIA MENINGITIDIS: NOT DETECTED
Proteus species: NOT DETECTED
Pseudomonas aeruginosa: NOT DETECTED
SERRATIA MARCESCENS: NOT DETECTED
STAPHYLOCOCCUS SPECIES: DETECTED — AB
Staphylococcus aureus (BCID): NOT DETECTED
Streptococcus agalactiae: NOT DETECTED
Streptococcus pneumoniae: NOT DETECTED
Streptococcus pyogenes: NOT DETECTED
Streptococcus species: NOT DETECTED
VANCOMYCIN RESISTANCE: NOT DETECTED

## 2015-08-10 LAB — GLUCOSE, CAPILLARY
Comment 1: 1
GLUCOSE-CAPILLARY: 299 mg/dL — AB (ref 65–99)
GLUCOSE-CAPILLARY: 245 mg/dL — AB (ref 65–99)
Glucose-Capillary: 193 mg/dL — ABNORMAL HIGH (ref 65–99)
Glucose-Capillary: 278 mg/dL — ABNORMAL HIGH (ref 65–99)
Glucose-Capillary: 294 mg/dL — ABNORMAL HIGH (ref 65–99)
Glucose-Capillary: 307 mg/dL — ABNORMAL HIGH (ref 65–99)

## 2015-08-10 LAB — PROCALCITONIN: PROCALCITONIN: 0.25 ng/mL

## 2015-08-10 LAB — MAGNESIUM: Magnesium: 2.7 mg/dL — ABNORMAL HIGH (ref 1.7–2.4)

## 2015-08-10 LAB — PHOSPHORUS: Phosphorus: 2.5 mg/dL (ref 2.5–4.6)

## 2015-08-10 MED ORDER — QUETIAPINE FUMARATE 100 MG PO TABS
100.0000 mg | ORAL_TABLET | Freq: Every day | ORAL | Status: DC
  Administered 2015-08-10 – 2015-08-14 (×5): 100 mg via ORAL
  Filled 2015-08-10 (×5): qty 1

## 2015-08-10 MED ORDER — POLYETHYLENE GLYCOL 3350 17 G PO PACK
17.0000 g | PACK | Freq: Every day | ORAL | Status: DC
  Administered 2015-08-10 – 2015-08-13 (×4): 17 g via ORAL
  Filled 2015-08-10 (×5): qty 1

## 2015-08-10 MED ORDER — VANCOMYCIN HCL IN DEXTROSE 1-5 GM/200ML-% IV SOLN
1000.0000 mg | Freq: Three times a day (TID) | INTRAVENOUS | Status: DC
  Administered 2015-08-10 – 2015-08-12 (×6): 1000 mg via INTRAVENOUS
  Filled 2015-08-10 (×7): qty 200

## 2015-08-10 MED ORDER — VANCOMYCIN HCL 10 G IV SOLR
2000.0000 mg | Freq: Once | INTRAVENOUS | Status: AC
  Administered 2015-08-10: 2000 mg via INTRAVENOUS
  Filled 2015-08-10: qty 2000

## 2015-08-10 MED ORDER — SODIUM CHLORIDE 0.9 % IV SOLN
500.0000 mg | Freq: Four times a day (QID) | INTRAVENOUS | Status: DC
  Administered 2015-08-10 – 2015-08-12 (×8): 500 mg via INTRAVENOUS
  Filled 2015-08-10 (×11): qty 500

## 2015-08-10 MED ORDER — INSULIN ASPART 100 UNIT/ML ~~LOC~~ SOLN
2.0000 [IU] | SUBCUTANEOUS | Status: DC
  Administered 2015-08-10 – 2015-08-11 (×6): 2 [IU] via SUBCUTANEOUS

## 2015-08-10 MED ORDER — VANCOMYCIN HCL 10 G IV SOLR
2000.0000 mg | Freq: Once | INTRAVENOUS | Status: DC
  Administered 2015-08-10: 2000 mg via INTRAVENOUS
  Filled 2015-08-10: qty 2000

## 2015-08-10 MED ORDER — INSULIN NPH (HUMAN) (ISOPHANE) 100 UNIT/ML ~~LOC~~ SUSP
45.0000 [IU] | Freq: Two times a day (BID) | SUBCUTANEOUS | Status: DC
  Administered 2015-08-10 – 2015-08-11 (×2): 45 [IU] via SUBCUTANEOUS
  Filled 2015-08-10: qty 10

## 2015-08-10 MED ORDER — FUROSEMIDE 10 MG/ML IJ SOLN
40.0000 mg | Freq: Three times a day (TID) | INTRAMUSCULAR | Status: DC
  Administered 2015-08-10 – 2015-08-11 (×4): 40 mg via INTRAVENOUS
  Filled 2015-08-10 (×4): qty 4

## 2015-08-10 MED ORDER — VANCOMYCIN HCL IN DEXTROSE 1-5 GM/200ML-% IV SOLN
1000.0000 mg | Freq: Three times a day (TID) | INTRAVENOUS | Status: DC
  Filled 2015-08-10 (×2): qty 200

## 2015-08-10 MED ORDER — INSULIN NPH (HUMAN) (ISOPHANE) 100 UNIT/ML ~~LOC~~ SUSP
30.0000 [IU] | Freq: Two times a day (BID) | SUBCUTANEOUS | Status: DC
  Filled 2015-08-10: qty 10

## 2015-08-10 MED ORDER — DEXTROSE 5 % IV SOLN
INTRAVENOUS | Status: DC
  Administered 2015-08-10 – 2015-08-12 (×2): via INTRAVENOUS

## 2015-08-10 NOTE — Progress Notes (Signed)
Pharmacy Antibiotic Note  Zachary Blevins is a 70 y.o. male admitted on 07/28/2015 with pneumonia.  Pharmacy has been consulted for vancomycin and imipenem dosing.  Plan: Vancomycin 2g (complete loading dose from prior order) then 1g IV every 8 hours.  Goal trough 15-20 mcg/mL. Imipenem 500mg  IV every 6 hours. Monitor renal function, clinical status, and culture results.   Height: 5\' 11"  (180.3 cm) Weight: (!) 300 lb 4.3 oz (136.2 kg) IBW/kg (Calculated) : 75.3  Temp (24hrs), Avg:100.1 F (37.8 C), Min:99 F (37.2 C), Max:101.4 F (38.6 C)   Recent Labs Lab 07/19/2015 1719  08/06/15 0448 08/06/15 1648 08/07/15 0332 08/08/15 0241 08/09/15 0323 08/10/15 0240  WBC  --   --  11.8*  --  12.9* 12.0* 13.1* 12.6*  CREATININE  --   < > 1.26* 1.13 1.13 1.24 1.16 1.14  LATICACIDVEN 1.9  --   --   --   --   --   --   --   < > = values in this interval not displayed.  Estimated Creatinine Clearance: 83.8 mL/min (by C-G formula based on Cr of 1.14).    Allergies  Allergen Reactions  . Codeine Nausea And Vomiting    Antimicrobials this admission: 5/31 Vancomycin >>  5/31 Imipenem >>   Dose adjustments this admission:   Microbiology results: 5/30 BCx: 1/2 MRSE 5/29 UCx:  5/30 Sputum: normal flora 5/26 MRSA PCR: negative  Thank you for allowing pharmacy to be a part of this patient's care.  Fayne NorrieMillen, Jeannene Tschetter Brown 08/10/2015 9:51 AM

## 2015-08-10 NOTE — Progress Notes (Signed)
RT note- keep sp02 >90%, fio2 and peep adjusted.

## 2015-08-10 NOTE — Progress Notes (Signed)
CBG's remain high with previous discontinuation of lantus, and addition of NPH.  No changes per E-link.

## 2015-08-10 NOTE — Progress Notes (Signed)
Inpatient Diabetes Program Recommendations  AACE/ADA: New Consensus Statement on Inpatient Glycemic Control (2015)  Target Ranges:  Prepandial:   less than 140 mg/dL      Peak postprandial:   less than 180 mg/dL (1-2 hours)      Critically ill patients:  140 - 180 mg/dL   Review of Glycemic Control  Glucose uncontrolled therefore recommend using the ICU Hyperglycemia Protocol.   Thank you  Piedad ClimesGina Nyiah Pianka BSN, RN,CDE Inpatient Diabetes Coordinator [phone] (team pager)

## 2015-08-10 NOTE — Progress Notes (Signed)
RT note- weaning PEEP as tolerated.

## 2015-08-10 NOTE — Progress Notes (Signed)
Patient's temp is 102.9 . Stacie NP and Anders SimmondsPete Babcock informed , and patient placed on cooling blanket

## 2015-08-10 NOTE — Progress Notes (Signed)
PRN neb treatment given per pt wheezing and decrease aeration throughout lung fields.

## 2015-08-10 NOTE — Progress Notes (Signed)
PULMONARY / CRITICAL CARE MEDICINE   Name: Zachary Blevins MRN: [id number] DOB: 1945-07-26    ADMISSION DATE:  2015-08-23 CONSULTATION DATE:  08-23-15  REFERRING MD:  [facility]   CHIEF COMPLAINT:  Acute Respiratory Failure   SUBJECTIVE: no pressors, lasix given yesterday   VITAL SIGNS: BP 126/55 mmHg  Pulse 81  Temp(Src) 100.4 F (38 C) (Oral)  Resp 18  Ht  (1.803 m)  Wt 136.2 kg (300 lb 4.3 oz)  BMI 41.90 kg/m2  SpO2 96%  HEMODYNAMICS:    VENTILATOR SETTINGS: Vent Mode:  [-] PRVC FiO2 (%):  [40 %] 40 % Set Rate:  [18 bmp] 18 bmp Vt Set:  [600 mL] 600 mL PEEP:  [16 cmH20] 16 cmH20 Plateau Pressure:  [27 cmH20-33 cmH20] 27 cmH20  INTAKE / OUTPUT: I/O last 3 completed shifts: In: 4961.7 [I.V.:1465; NG/GT:2780; IV Piggyback:716.7] Out: 3800 [Urine:3800]  PHYSICAL EXAMINATION: General:  Morbidly obese. Neuro:  FC yes int HEENT:  Endotracheal tube in place. Bilateral scleral injection. Cardiovascular:  s1 s2 RRR Lungs: ronchi, distant Abdomen:  Protuberant. Soft. Hypoactive BS. Integument:  Warm & dry. No rash on exposed skin.  LABS:  BMET  Recent Labs Lab 08/08/15 0241 08/09/15 0323 08/10/15 0240  NA 143 144 147*  K 4.0 5.0 4.6  CL 101 106 106  CO2 34* 32 34*  BUN 52* 56* 50*  CREATININE 1.24 1.16 1.14  GLUCOSE 276* 315* 300*    Electrolytes  Recent Labs Lab 08/06/15 0448  08/08/15 0241 08/09/15 0323 08/10/15 0240  CALCIUM 7.7*  < > 8.0* 7.7* 8.2*  MG 2.4  --   --   --  2.7*  PHOS 5.0*  --   --   --  2.5  < > = values in this interval not displayed.  CBC  Recent Labs Lab 08/08/15 0241 08/09/15 0323 08/10/15 0240  WBC 12.0* 13.1* 12.6*  HGB 14.9 13.4 13.6  HCT 49.3 45.6 45.7  PLT 189 176 209    Coag's No results for input(s): APTT, INR in the last 168 hours.  Sepsis Markers  Recent Labs Lab 2015/08/23 1719  08/07/15 0332 08/09/15 1136 08/10/15 0240  LATICACIDVEN 1.9  --   --   --   --   PROCALCITON <0.10  <  > 0.13 0.21 0.25  < > = values in this interval not displayed.  ABG  Recent Labs Lab Aug 23, 2015 1827 08/06/15 0345  PHART 7.389 7.419  PCO2ART 49.2* 48.8*  PO2ART 66.0* 85.6    Liver Enzymes  Recent Labs Lab 08/06/15 0448 08/07/15 0332 08/10/15 0240  AST 143* 41 31  ALT 199* 164* 55  ALKPHOS 79 86 97  BILITOT 1.0 1.4* 0.9  ALBUMIN 2.8* 2.9* 2.3*    Cardiac Enzymes  Recent Labs Lab 08-23-15 1719 2015-08-23 2304 08/06/15 0448  TROPONINI 0.06* 0.06* 0.06*    Glucose  Recent Labs Lab 08/09/15 0834 08/09/15 1133 08/09/15 1547 08/09/15 1918 08/09/15 2333 08/10/15 0335  GLUCAP 258* 291* 318* 292* 307* 299*    Imaging - IMAGES PERSONALLY REVIEWED No results found.  STUDIES:  CT Chest 5/26 (OSH): no evidence of PE, extensive atx of LUL, lingula, bilateral lower lobes, small bilateral effusions, 4.5 cm ascending thoracic aortic aneurysm recommended for semi-annual follow up with Thoracic Surgery ECHO 5/26: LVEF 60-65%, no wall motion, grade 1 diastolic dysfunction, mild aortic root dilation RUQ [place] 5/28: Hepatic steatosis w/ patent porta vein & normal flow. No biliary ductal dilation.  CULTURES: OSH  UC 5/22:  Negative BCx2 5/21:  Negative  Tracheal Aspirate 5/21: Normal Oral Flora  BCx2 5/26 >>> Tracheal Aspirate 5/26:  Oral Flora  U. Strep 5/26:  Negative U. Legionella 5/26:  Negative   UrC 5/29>> BC x2 5/30>>coag neg staph Trach Asp 5/30>>  ANTIBIOTICS: None.  SIGNIFICANT EVENTS: 5/21  [facility], intubated, thought CAP, +/- CHF  5/28 collapse left lung  LINES/TUBES: ETT 5/21 >> exchanged 5/26 >>  OGT >> Foley >> PIV x2  ASSESSMENT / PLAN:  PULMONARY A: Acute Hypoxemic Respiratory Failure - Suspect pulmonary edema from diastolic CHF. CAP - Unlikely. Cultures negative so far. Bilateral Pleural Effusions Suspected COPD  Possible Aspiration Event - pm 5/27 with vomiting / OGT coiled in  mouth Collapse 5/26 - resolved without intervention  P:   Peep 16 noted, need peep reduction, if collapses would bronch Keep same mV Duoneb q6hr Unable to wean pcxr in am  Will need trach when peep allows  CARDIOVASCULAR A:  Acute Diastolic CHF  H/O HTN & HLD Elevated Troponin I - Likely demand ischemia. Atherosclerotic CAD - Noted on CT scan. Ascending Thoracic Aneurysm - [facility]    P:  POS daily balance - need neg goal 1 liter lasix scheduled  RENAL A:   Pos balance daily hypernatremia P:   Lasix tid Chem in am  Add d5w at 40 cc.hr  GASTROINTESTINAL A:   Morbid Obesity  Transaminitis - Likely congestive hepatopathy w/ CHF.  Hepatic Steatosis/Early Cirrhosis - Seen on RUQ U/S. Prior h/o EtOH stopped 1990s. No BM  P:   Continue TF Protonix VT daily Senna VT bid Need a BM, add mirilax  HEMATOLOGIC A:   Leukocytosis - Mild. Possible SIRS. Anemia - Resolved. Thrombocytopenia - Resolved.  P:  Trending cell counts daily w/ CBC Heparin [place] q8hr SCDs  INFECTIOUS A:   FUO- improved PCT low P:   Following cultures to completion.  Dc vanc, coag neg staph likely contimation  ENDOCRINE A:   DM II  - Blood glucose uncontrolled.  P:   Accu-Checks q4hr SSI per Resistant Algorithm NPH 30u [place] to q12h   NEUROLOGIC A:   Acute Encephalopathy - Multifactorial w/ hypoxia.  P:   RASS goal: -1 Fentanyl gtt & IV prn Versed gtt & IV prn Seroquel qhs, max this agent   FAMILY  - Updates:  none  - Inter-disciplinary family meet or Palliative Care meeting due by:  6/1  Ccm time 30 min   Mcarthur Rossettianiel J. Tyson AliasFeinstein, MD, FACP Pgr: [phone] [facility]

## 2015-08-10 NOTE — Progress Notes (Signed)
Pt was bagged lavage with a PEEP valve x per pt desaturation episodes down in the mid 80's. Pt was suctioned on 100% FiO2, got back copious amounts of tan yellow secretions. RN aware and at bedside. FiO2 increase from 50 to 60. RT will continue to monitor patient status.

## 2015-08-10 NOTE — Progress Notes (Signed)
eLink Physician-Brief Progress Note Patient Name: Zachary Blevins DOB: [birth year] MRN: [id number]   Date of Service  08/10/2015  HPI/Events of Note  1/2 GPCs in Blood  eICU Interventions  Start Vancomycin     Intervention Category Intermediate Interventions: Other:  Sheron Tallman 08/10/2015, 6:47 AM

## 2015-08-10 NOTE — Progress Notes (Signed)
PHARMACY - PHYSICIAN COMMUNICATION CRITICAL VALUE ALERT - BLOOD CULTURE IDENTIFICATION (BCID)  1/2 Blood cultures with Staph species--methicillin resistance detected.  No results found for this or any previous visit.  Name of physician (or Provider) Contacted: Dr. Isaiah SergeMannam CCM  Changes to prescribed antibiotics required: Vancomycin added  Zachary Blevins, Zachary Blevins 08/10/2015  6:46 AM

## 2015-08-10 NOTE — Progress Notes (Signed)
Found ETT 26lip RN aware. Pt is receiving good volumes and expiratory volumes are correlating to set VT. Per PT intubation Sep 25, 2015, ETT is 23lip. Pt is stable at this time no distress or complications noted.

## 2015-08-10 NOTE — Progress Notes (Signed)
Pharmacy Antibiotic Note  Zachary Blevins is a 70 y.o. male with low grade fevers leukocytosis, now with 1/2 blood cultures growing MRSE. Pharmacy has been consulted for Vancomycin dosing.  Plan: Vancomycin 2000 mg IV now, then 1 g IV q8h  Height: 5\' 11"  (180.3 cm) Weight: (!) 300 lb 4.3 oz (136.2 kg) IBW/kg (Calculated) : 75.3  Temp (24hrs), Avg:99.8 F (37.7 C), Min:99 F (37.2 C), Max:100.7 F (38.2 C)   Recent Labs Lab 07/25/2015 1719  08/06/15 0448 08/06/15 1648 08/07/15 0332 08/08/15 0241 08/09/15 0323 08/10/15 0240  WBC  --   --  11.8*  --  12.9* 12.0* 13.1* 12.6*  CREATININE  --   < > 1.26* 1.13 1.13 1.24 1.16 1.14  LATICACIDVEN 1.9  --   --   --   --   --   --   --   < > = values in this interval not displayed.  Estimated Creatinine Clearance: 83.8 mL/min (by C-G formula based on Cr of 1.14).    Allergies  Allergen Reactions  . Codeine Nausea And Vomiting    Antimicrobials this admission: Vancomycin 5/31 >>   Microbiology results: 5/31 BCx: 1/2 : MRSE 5/29 UCx: pending  5/30 Sputum: pending  5/26 MRSA PCR: neg   Eddie Candlebbott, Vernie Vinciguerra Vernon 08/10/2015 6:50 AM

## 2015-08-11 ENCOUNTER — Inpatient Hospital Stay (HOSPITAL_COMMUNITY): Payer: Medicare Other

## 2015-08-11 LAB — COMPREHENSIVE METABOLIC PANEL
ALT: 55 U/L (ref 17–63)
ANION GAP: 7 (ref 5–15)
AST: 31 U/L (ref 15–41)
Albumin: 2.1 g/dL — ABNORMAL LOW (ref 3.5–5.0)
Alkaline Phosphatase: 130 U/L — ABNORMAL HIGH (ref 38–126)
BUN: 46 mg/dL — ABNORMAL HIGH (ref 6–20)
CALCIUM: 8.3 mg/dL — AB (ref 8.9–10.3)
CHLORIDE: 105 mmol/L (ref 101–111)
CO2: 33 mmol/L — AB (ref 22–32)
Creatinine, Ser: 1.17 mg/dL (ref 0.61–1.24)
Glucose, Bld: 325 mg/dL — ABNORMAL HIGH (ref 65–99)
Potassium: 4 mmol/L (ref 3.5–5.1)
SODIUM: 145 mmol/L (ref 135–145)
Total Bilirubin: 0.9 mg/dL (ref 0.3–1.2)
Total Protein: 5.2 g/dL — ABNORMAL LOW (ref 6.5–8.1)

## 2015-08-11 LAB — GLUCOSE, CAPILLARY
GLUCOSE-CAPILLARY: 283 mg/dL — AB (ref 65–99)
GLUCOSE-CAPILLARY: 304 mg/dL — AB (ref 65–99)
GLUCOSE-CAPILLARY: 312 mg/dL — AB (ref 65–99)
GLUCOSE-CAPILLARY: 279 mg/dL — AB (ref 65–99)
Glucose-Capillary: 224 mg/dL — ABNORMAL HIGH (ref 65–99)
Glucose-Capillary: 239 mg/dL — ABNORMAL HIGH (ref 65–99)

## 2015-08-11 LAB — CBC WITH DIFFERENTIAL/PLATELET
BASOS ABS: 0 10*3/uL (ref 0.0–0.1)
BASOS PCT: 0 %
EOS PCT: 3 %
Eosinophils Absolute: 0.4 10*3/uL (ref 0.0–0.7)
HCT: 47.8 % (ref 39.0–52.0)
Hemoglobin: 14.1 g/dL (ref 13.0–17.0)
LYMPHS PCT: 12 %
Lymphs Abs: 1.5 10*3/uL (ref 0.7–4.0)
MCH: 28.2 pg (ref 26.0–34.0)
MCHC: 29.5 g/dL — ABNORMAL LOW (ref 30.0–36.0)
MCV: 95.6 fL (ref 78.0–100.0)
Monocytes Absolute: 1.2 10*3/uL — ABNORMAL HIGH (ref 0.1–1.0)
Monocytes Relative: 10 %
Neutro Abs: 8.8 10*3/uL — ABNORMAL HIGH (ref 1.7–7.7)
Neutrophils Relative %: 75 %
PLATELETS: 204 10*3/uL (ref 150–400)
RBC: 5 MIL/uL (ref 4.22–5.81)
RDW: 14.5 % (ref 11.5–15.5)
WBC: 11.9 10*3/uL — AB (ref 4.0–10.5)

## 2015-08-11 LAB — CULTURE, RESPIRATORY W GRAM STAIN: Culture: NORMAL

## 2015-08-11 LAB — URINE CULTURE

## 2015-08-11 LAB — PHOSPHORUS: PHOSPHORUS: 2.8 mg/dL (ref 2.5–4.6)

## 2015-08-11 LAB — CULTURE, RESPIRATORY: SPECIAL REQUESTS: NORMAL

## 2015-08-11 LAB — MAGNESIUM: MAGNESIUM: 2.4 mg/dL (ref 1.7–2.4)

## 2015-08-11 LAB — PROCALCITONIN: PROCALCITONIN: 0.25 ng/mL

## 2015-08-11 MED ORDER — FUROSEMIDE 10 MG/ML IJ SOLN
80.0000 mg | Freq: Four times a day (QID) | INTRAMUSCULAR | Status: DC
  Administered 2015-08-11 – 2015-08-12 (×3): 80 mg via INTRAVENOUS
  Filled 2015-08-11 (×4): qty 8

## 2015-08-11 MED ORDER — INSULIN NPH (HUMAN) (ISOPHANE) 100 UNIT/ML ~~LOC~~ SUSP
55.0000 [IU] | Freq: Two times a day (BID) | SUBCUTANEOUS | Status: DC
  Administered 2015-08-11 – 2015-08-13 (×4): 55 [IU] via SUBCUTANEOUS
  Filled 2015-08-11: qty 10

## 2015-08-11 MED ORDER — ANTISEPTIC ORAL RINSE SOLUTION (CORINZ)
7.0000 mL | OROMUCOSAL | Status: DC
  Administered 2015-08-11 – 2015-08-14 (×24): 7 mL via OROMUCOSAL

## 2015-08-11 MED ORDER — MIDAZOLAM HCL 2 MG/2ML IJ SOLN
2.0000 mg | INTRAMUSCULAR | Status: DC | PRN
  Administered 2015-08-12 (×3): 2 mg via INTRAVENOUS
  Filled 2015-08-11 (×3): qty 2

## 2015-08-11 MED ORDER — ANTISEPTIC ORAL RINSE SOLUTION (CORINZ)
7.0000 mL | OROMUCOSAL | Status: DC
  Administered 2015-08-11 (×2): 7 mL via OROMUCOSAL

## 2015-08-11 MED ORDER — SODIUM CHLORIDE 0.9% FLUSH
10.0000 mL | Freq: Two times a day (BID) | INTRAVENOUS | Status: DC
  Administered 2015-08-12 – 2015-08-14 (×6): 10 mL
  Administered 2015-08-15: 3 mL

## 2015-08-11 MED ORDER — SODIUM CHLORIDE 0.9% FLUSH: 10.0000 mL | INTRAVENOUS | Status: DC | PRN

## 2015-08-11 MED ORDER — INSULIN ASPART 100 UNIT/ML ~~LOC~~ SOLN
4.0000 [IU] | SUBCUTANEOUS | Status: DC
  Administered 2015-08-11 – 2015-08-12 (×5): 4 [IU] via SUBCUTANEOUS

## 2015-08-11 MED ORDER — BISACODYL 10 MG RE SUPP: 10.0000 mg | Freq: Every day | RECTAL | Status: DC | PRN

## 2015-08-11 NOTE — Progress Notes (Signed)
Pt was bagged lavage with a PEEP valve x per pt desaturation down in the mid 80's. Pt was suctioned on 100% FiO2, got back moderate amount of tan yellow secretions. RT will continue to monitor patient status. Pt is stable at this time satting 98%.

## 2015-08-11 NOTE — Progress Notes (Addendum)
PULMONARY / CRITICAL CARE MEDICINE   Name: Zachary Blevins MRN: [id number] DOB: 07/14/1945    ADMISSION DATE:  08/29/2015 CONSULTATION DATE:  2015-08-29  REFERRING MD:  [facility]   CHIEF COMPLAINT:  Acute Respiratory Failure   SUBJECTIVE: peep to 5 successful and 60% Fever curve better  VITAL SIGNS: BP 133/59 mmHg  Pulse 81  Temp(Src) 98.4 F (36.9 C) (Oral)  Resp 18  Ht  (1.803 m)  Wt 141.1 kg (311 lb 1.1 oz)  BMI 43.40 kg/m2  SpO2 93%  HEMODYNAMICS:    VENTILATOR SETTINGS: Vent Mode:  [-] PRVC FiO2 (%):  [50 %-60 %] 60 % Set Rate:  [18 bmp] 18 bmp Vt Set:  [600 mL] 600 mL PEEP:  [5 cmH20-8 cmH20] 5 cmH20 Plateau Pressure:  [18 cmH20-21 cmH20] 20 cmH20  INTAKE / OUTPUT: I/O last 3 completed shifts: In: 4098 [I.V.:1583; NG/GT:2280; IV Piggyback:800] Out: 4350 [Urine:4350]  PHYSICAL EXAMINATION: General:  Morbidly obese. Neuro:  FC , perrl HEENT:  Endotracheal tube in place. Bilateral scleral injection. Cardiovascular:  s1 s2 RRR Lungs: reduced left Abdomen:  Protuberant. Soft. Low BS. Integument:  Warm & dry. No rash on exposed skin.  LABS:  BMET  Recent Labs Lab 08/09/15 0323 08/10/15 0240 08/11/15 0330  NA 144 147* 145  K 5.0 4.6 4.0  CL 106 106 105  CO2 32 34* 33*  BUN 56* 50* 46*  CREATININE 1.16 1.14 1.17  GLUCOSE 315* 300* 325*    Electrolytes  Recent Labs Lab 08/06/15 0448  08/09/15 0323 08/10/15 0240 08/11/15 0330  CALCIUM 7.7*  < > 7.7* 8.2* 8.3*  MG 2.4  --   --  2.7* 2.4  PHOS 5.0*  --   --  2.5 2.8  < > = values in this interval not displayed.  CBC  Recent Labs Lab 08/09/15 0323 08/10/15 0240 08/11/15 0330  WBC 13.1* 12.6* 11.9*  HGB 13.4 13.6 14.1  HCT 45.6 45.7 47.8  PLT 176 209 204    Coag's No results for input(s): APTT, INR in the last 168 hours.  Sepsis Markers  Recent Labs Lab 2015-08-29 1719  08/09/15 1136 08/10/15 0240 08/11/15 0330  LATICACIDVEN 1.9  --   --   --   --   PROCALCITON  <0.10  < > 0.21 0.25 0.25  < > = values in this interval not displayed.  ABG  Recent Labs Lab 08/29/15 1827 08/06/15 0345  PHART 7.389 7.419  PCO2ART 49.2* 48.8*  PO2ART 66.0* 85.6    Liver Enzymes  Recent Labs Lab 08/07/15 0332 08/10/15 0240 08/11/15 0330  AST 41 31 31  ALT 164* 55 55  ALKPHOS 86 97 130*  BILITOT 1.4* 0.9 0.9  ALBUMIN 2.9* 2.3* 2.1*    Cardiac Enzymes  Recent Labs Lab 08-29-15 1719 Aug 29, 2015 2304 08/06/15 0448  TROPONINI 0.06* 0.06* 0.06*    Glucose  Recent Labs Lab 08/10/15 1128 08/10/15 1606 08/10/15 1930 08/10/15 2303 08/11/15 0323 08/11/15 0832  GLUCAP 294* 193* 245* 283* 304* 312*    Imaging - IMAGES PERSONALLY REVIEWED Dg Chest Port 1 View  08/11/2015  CLINICAL DATA:  Endotracheal tube EXAM: PORTABLE CHEST 1 VIEW COMPARISON:  08/09/2015 FINDINGS: Endotracheal tube in unchanged position. Enteric tube again identified as well. Cardiac enlargement stable. Vascular congestion. Indistinct perihilar vasculature. Hazy opacity lower 2/3 left lung and lower 1/3 right lung. IMPRESSION: Increased bilateral opacities suggest significant congestive heart failure with pulmonary edema and probably bilateral pleural effusions. Electronically Signed  By: Esperanza Heiraymond  Rubner M.D.   On: 08/11/2015 10:08    STUDIES:  CT Chest 5/26 (OSH): no evidence of PE, extensive atx of LUL, lingula, bilateral lower lobes, small bilateral effusions, 4.5 cm ascending thoracic aortic aneurysm recommended for semi-annual follow up with Thoracic Surgery ECHO 5/26: LVEF 60-65%, no wall motion, grade 1 diastolic dysfunction, mild aortic root dilation RUQ [place] 5/28: Hepatic steatosis w/ patent porta vein & normal flow. No biliary ductal dilation.  CULTURES: OSH  UC 5/22:  Negative BCx2 5/21:  Negative  Tracheal Aspirate 5/21: Normal Oral Flora  BCx2 5/26 >>> Tracheal Aspirate 5/26:  Oral Flora  U. Strep 5/26:  Negative U. Legionella 5/26:  Negative   UrC 5/29>>res  enteroccus BC x2 5/30>>coag neg staph Trach Asp 5/30>>  ANTIBIOTICS: vanc 5/31>>> Imiepenem 5/31>>>  SIGNIFICANT EVENTS: 5/21  Admit [facility] with respiratory distress, intubated, thought CAP, +/- CHF  5/28 collapse left lung  LINES/TUBES: ETT 5/21 >> exchanged 5/26 >>  OGT >> Foley >> PIV x2  ASSESSMENT / PLAN:  PULMONARY A: Acute Hypoxemic Respiratory Failure - Suspect pulmonary edema from diastolic CHF. CAP - Unlikely. Cultures negative so far. Bilateral Pleural Effusions Suspected COPD  Possible Aspiration Event - pm 5/27 with vomiting / OGT coiled in mouth Collapse 5/26 - resolved without intervention  P:   Peep major improved, to balance goal neg , see renal pcxr in am  With goal neg balance To goal 50%, consider cpap 5 ps 5 as Peep is improved Attempt to prolong ett in hopes to avoid trach possibley, with improvements  CARDIOVASCULAR A:  Acute Diastolic CHF  H/O HTN & HLD Elevated Troponin I - Likely demand ischemia. Atherosclerotic CAD - Noted on CT scan. Ascending Thoracic Aneurysm - [facility]ed on 5/[facility].    P:  POS daily again, to 80 q6h lasix scheduled increase  RENAL A:   Pos balance daily hypernatremia P:   Lasix tid, increase q6h 80 mg May need zarox Chem in am  Keep d5w for NA   GASTROINTESTINAL A:   Morbid Obesity  Transaminitis - Likely congestive hepatopathy w/ CHF.  Hepatic Steatosis/Early Cirrhosis - Seen on RUQ U/S. Prior h/o EtOH stopped 1990s. No BM  P:   Continue TF Protonix VT daily Senna VT bid mirilax May need enema dulx supp  HEMATOLOGIC A:   Leukocytosis - Mild. Possible SIRS. Anemia - Resolved. Thrombocytopenia - Resolved.  P:  Trending cell counts daily w/ CBC Heparin [place] q8hr maintain SCDs  INFECTIOUS A:   FUO- improved,urine source likely PCT low CAUTI? P:   Noted enterococcus, keep vanc Keep imipenem until resp culture final Avoid invasive bronch for now as clinically  improved Change foley  ENDOCRINE A:   DM II  - Blood glucose uncontrolled.  P:   Accu-Checks q4hr SSI per Resistant Algorithm NPH 55u [place] to q12h, increased TF coverage to 4   NEUROLOGIC A:   Acute Encephalopathy - Multifactorial w/ hypoxia.  P:   RASS goal: -1 Fentanyl gtt & IV prn Versed gtt to goal dc, add versed prn to dc Seroquel qhs 100  FAMILY  - Updates:  Daughters updated 5/31  - Inter-disciplinary family meet or Palliative Care meeting due by:  6/1  Ccm time 30 min   Mcarthur Rossettianiel J. Tyson AliasFeinstein, MD, FACP Pgr: [phone] [facility]

## 2015-08-11 NOTE — Progress Notes (Signed)
Inpatient Diabetes Program Recommendations  AACE/ADA: New Consensus Statement on Inpatient Glycemic Control (2015)  Target Ranges:  Prepandial:   less than 140 mg/dL      Peak postprandial:   less than 180 mg/dL (1-2 hours)      Critically ill patients:  140 - 180 mg/dL   Lab Results  Component Value Date   GLUCAP 312* 08/11/2015    Review of Glycemic Control:  Results for Zachary Blevins, Duglas N (MRN [id number]) as of 08/11/2015 11:04  Ref. Range 08/10/2015 09:04 08/10/2015 11:28 08/10/2015 16:06 08/10/2015 19:30 08/10/2015 23:03 08/11/2015 03:23 08/11/2015 08:32  Glucose-Capillary Latest Ref Range: 65-99 mg/dL 528278 (H) 413294 (H) 244193 (H) 245 (H) 283 (H) 304 (H) 312 (H)   Inpatient Diabetes Program Recommendations:  Please restart Phase 2 ICU protocol due to blood sugars being > 300 mg/dL.  Thanks, Beryl MeagerJenny Ashwin Tibbs, RN, BC-ADM Inpatient Diabetes Coordinator Pager [phone] (8a-5p)

## 2015-08-11 NOTE — Progress Notes (Signed)
Peripherally Inserted Central Catheter/Midline Placement  The IV Nurse has discussed with the patient and/or persons authorized to consent for the patient, the purpose of this procedure and the potential benefits and risks involved with this procedure.  The benefits include less needle sticks, lab draws from the catheter and patient may be discharged home with the catheter.  Risks include, but not limited to, infection, bleeding, blood clot (thrombus formation), and puncture of an artery; nerve damage and irregular heat beat.  Alternatives to this procedure were also discussed.  PICC/Midline Placement Documentation        Zachary Blevins, Zachary Blevins 08/11/2015, 5:36 PM

## 2015-08-11 DEATH — deceased

## 2015-08-12 ENCOUNTER — Inpatient Hospital Stay (HOSPITAL_COMMUNITY): Payer: Medicare Other

## 2015-08-12 DIAGNOSIS — I5033 Acute on chronic diastolic (congestive) heart failure: Secondary | ICD-10-CM

## 2015-08-12 DIAGNOSIS — Z9289 Personal history of other medical treatment: Secondary | ICD-10-CM

## 2015-08-12 DIAGNOSIS — Z4502 Encounter for adjustment and management of automatic implantable cardiac defibrillator: Secondary | ICD-10-CM | POA: Insufficient documentation

## 2015-08-12 DIAGNOSIS — J189 Pneumonia, unspecified organism: Secondary | ICD-10-CM | POA: Insufficient documentation

## 2015-08-12 LAB — CULTURE, BLOOD (ROUTINE X 2)

## 2015-08-12 LAB — CBC WITH DIFFERENTIAL/PLATELET
Basophils Absolute: 0 10*3/uL (ref 0.0–0.1)
Basophils Relative: 0 %
EOS ABS: 0.5 10*3/uL (ref 0.0–0.7)
EOS PCT: 4 %
HCT: 44.2 % (ref 39.0–52.0)
Hemoglobin: 13.2 g/dL (ref 13.0–17.0)
LYMPHS ABS: 1.5 10*3/uL (ref 0.7–4.0)
Lymphocytes Relative: 11 %
MCH: 28.2 pg (ref 26.0–34.0)
MCHC: 29.9 g/dL — AB (ref 30.0–36.0)
MCV: 94.4 fL (ref 78.0–100.0)
MONOS PCT: 8 %
Monocytes Absolute: 1.1 10*3/uL — ABNORMAL HIGH (ref 0.1–1.0)
Neutro Abs: 10.6 10*3/uL — ABNORMAL HIGH (ref 1.7–7.7)
Neutrophils Relative %: 77 %
PLATELETS: 222 10*3/uL (ref 150–400)
RBC: 4.68 MIL/uL (ref 4.22–5.81)
RDW: 14.3 % (ref 11.5–15.5)
WBC: 13.7 10*3/uL — AB (ref 4.0–10.5)

## 2015-08-12 LAB — COMPREHENSIVE METABOLIC PANEL
ALBUMIN: 2 g/dL — AB (ref 3.5–5.0)
ALK PHOS: 188 U/L — AB (ref 38–126)
ALT: 76 U/L — ABNORMAL HIGH (ref 17–63)
ANION GAP: 7 (ref 5–15)
AST: 62 U/L — ABNORMAL HIGH (ref 15–41)
BILIRUBIN TOTAL: 1.1 mg/dL (ref 0.3–1.2)
BUN: 44 mg/dL — ABNORMAL HIGH (ref 6–20)
CALCIUM: 8.5 mg/dL — AB (ref 8.9–10.3)
CO2: 35 mmol/L — ABNORMAL HIGH (ref 22–32)
Chloride: 102 mmol/L (ref 101–111)
Creatinine, Ser: 1.12 mg/dL (ref 0.61–1.24)
GFR calc Af Amer: >60 mL/min (ref >60)
GLUCOSE: 258 mg/dL — AB (ref 65–99)
Potassium: 3.9 mmol/L (ref 3.5–5.1)
Sodium: 144 mmol/L (ref 135–145)
TOTAL PROTEIN: 5.4 g/dL — AB (ref 6.5–8.1)

## 2015-08-12 LAB — GLUCOSE, CAPILLARY
GLUCOSE-CAPILLARY: 291 mg/dL — AB (ref 65–99)
GLUCOSE-CAPILLARY: 193 mg/dL — AB (ref 65–99)
GLUCOSE-CAPILLARY: 250 mg/dL — AB (ref 65–99)
Glucose-Capillary: 159 mg/dL — ABNORMAL HIGH (ref 65–99)
Glucose-Capillary: 267 mg/dL — ABNORMAL HIGH (ref 65–99)
Glucose-Capillary: 272 mg/dL — ABNORMAL HIGH (ref 65–99)

## 2015-08-12 LAB — VANCOMYCIN, TROUGH: VANCOMYCIN TR: 25 ug/mL — AB (ref 10.0–20.0)

## 2015-08-12 LAB — POCT I-STAT 3, ART BLOOD GAS (G3+)
Acid-Base Excess: 14 mmol/L — ABNORMAL HIGH (ref 0.0–2.0)
Bicarbonate: 40.7 mEq/L — ABNORMAL HIGH (ref 20.0–24.0)
O2 Saturation: 87 %
PCO2 ART: 59.9 mmHg — AB (ref 35.0–45.0)
PH ART: 7.441 (ref 7.350–7.450)
TCO2: 43 mmol/L (ref 0–100)
pO2, Arterial: 54 mmHg — ABNORMAL LOW (ref 80.0–100.0)

## 2015-08-12 LAB — MAGNESIUM: Magnesium: 2 mg/dL (ref 1.7–2.4)

## 2015-08-12 LAB — PHOSPHORUS: Phosphorus: 3.8 mg/dL (ref 2.5–4.6)

## 2015-08-12 MED ORDER — LACTULOSE 10 GM/15ML PO SOLN
30.0000 g | Freq: Three times a day (TID) | ORAL | Status: DC
  Administered 2015-08-12 – 2015-08-13 (×6): 30 g
  Filled 2015-08-12 (×7): qty 45

## 2015-08-12 MED ORDER — METOLAZONE 5 MG PO TABS
5.0000 mg | ORAL_TABLET | Freq: Two times a day (BID) | ORAL | Status: DC
  Administered 2015-08-12 – 2015-08-13 (×4): 5 mg via ORAL
  Filled 2015-08-12 (×5): qty 1

## 2015-08-12 MED ORDER — VANCOMYCIN HCL IN DEXTROSE 1-5 GM/200ML-% IV SOLN
1000.0000 mg | Freq: Two times a day (BID) | INTRAVENOUS | Status: DC
  Administered 2015-08-13 – 2015-08-15 (×5): 1000 mg via INTRAVENOUS
  Filled 2015-08-12 (×6): qty 200

## 2015-08-12 MED ORDER — INSULIN ASPART 100 UNIT/ML ~~LOC~~ SOLN
5.0000 [IU] | SUBCUTANEOUS | Status: DC
  Administered 2015-08-12 – 2015-08-13 (×8): 5 [IU] via SUBCUTANEOUS

## 2015-08-12 MED ORDER — MIDAZOLAM HCL 2 MG/2ML IJ SOLN
2.0000 mg | INTRAMUSCULAR | Status: DC | PRN
  Administered 2015-08-12 – 2015-08-13 (×3): 2 mg via INTRAVENOUS
  Filled 2015-08-12 (×3): qty 2

## 2015-08-12 MED ORDER — DEXTROSE 5 % IV SOLN
120.0000 mg | Freq: Three times a day (TID) | INTRAVENOUS | Status: DC
  Administered 2015-08-12 – 2015-08-14 (×6): 120 mg via INTRAVENOUS
  Filled 2015-08-12 (×8): qty 12

## 2015-08-12 NOTE — Progress Notes (Signed)
Patient with periods of agitation tonight, attempting to swing at staff. PRN Versed administered with relief but does not last long. Belia HemanKasa, MD notified. Versed switched to q1h PRN and restraints applied.

## 2015-08-12 NOTE — Progress Notes (Signed)
PULMONARY / CRITICAL CARE MEDICINE   Name: Zachary Blevins MRN: [id number] DOB: [birth year]    ADMISSION DATE:  08/01/2015 CONSULTATION DATE:  07/31/2015  REFERRING MD:  [facility]   CHIEF COMPLAINT:  Acute Respiratory Failure   SUBJECTIVE: peep remains 5, fio2 60%, fc  VITAL SIGNS: BP 123/62 mmHg  Pulse 82  Temp(Src) 98.5 F (36.9 C) (Oral)  Resp 18  Ht 5\' 11"  (1.803 m)  Wt 137 kg (302 lb 0.5 oz)  BMI 42.14 kg/m2  SpO2 93%  HEMODYNAMICS:    VENTILATOR SETTINGS: Vent Mode:  [-] PRVC FiO2 (%):  [60 %] 60 % Set Rate:  [18 bmp] 18 bmp Vt Set:  [600 mL] 600 mL PEEP:  [5 cmH20] 5 cmH20 Plateau Pressure:  [18 cmH20-21 cmH20] 20 cmH20  INTAKE / OUTPUT: I/O last 3 completed shifts: In: 6947.2 [I.V.:2862.2; WG/NF:6213; IV Piggyback:1400] Out: 7150 [Urine:7150]  PHYSICAL EXAMINATION: General:  Morbidly obese. Neuro:  FC , perrl HEENT:  Endotracheal tube in place Cardiovascular:  s1 s2 RRR, not tachy Lungs: reduced , less coarse Abdomen:  Obese, Soft. Low BS. Integument:  Warm & dry. No rash on exposed skin.  LABS:  BMET  Recent Labs Lab 08/10/15 0240 08/11/15 0330 08/12/15 0357  NA 147* 145 144  K 4.6 4.0 3.9  CL 106 105 102  CO2 34* 33* 35*  BUN 50* 46* 44*  CREATININE 1.14 1.17 1.12  GLUCOSE 300* 325* 258*    Electrolytes  Recent Labs Lab 08/10/15 0240 08/11/15 0330 08/12/15 0357  CALCIUM 8.2* 8.3* 8.5*  MG 2.7* 2.4 2.0  PHOS 2.5 2.8 3.8    CBC  Recent Labs Lab 08/10/15 0240 08/11/15 0330 08/12/15 0357  WBC 12.6* 11.9* 13.7*  HGB 13.6 14.1 13.2  HCT 45.7 47.8 44.2  PLT 209 204 222    Coag's No results for input(s): APTT, INR in the last 168 hours.  Sepsis Markers  Recent Labs Lab 07/16/2015 1719  08/09/15 1136 08/10/15 0240 08/11/15 0330  LATICACIDVEN 1.9  --   --   --   --   PROCALCITON <0.10  < > 0.21 0.25 0.25  < > = values in this interval not displayed.  ABG  Recent Labs Lab 08/07/2015 1827 08/06/15 0345  PHART  7.389 7.419  PCO2ART 49.2* 48.8*  PO2ART 66.0* 85.6    Liver Enzymes  Recent Labs Lab 08/10/15 0240 08/11/15 0330 08/12/15 0357  AST 31 31 62*  ALT 55 55 76*  ALKPHOS 97 130* 188*  BILITOT 0.9 0.9 1.1  ALBUMIN 2.3* 2.1* 2.0*    Cardiac Enzymes  Recent Labs Lab 07/18/2015 1719 07/23/2015 2304 08/06/15 0448  TROPONINI 0.06* 0.06* 0.06*    Glucose  Recent Labs Lab 08/11/15 0832 08/11/15 1158 08/11/15 1503 08/11/15 1920 08/11/15 2339 08/12/15 0318  GLUCAP 312* 279* 224* 239* 159* 267*    Imaging - IMAGES PERSONALLY REVIEWED Dg Chest Port 1 View  08/11/2015  CLINICAL DATA:  Pneumonia. EXAM: PORTABLE CHEST 1 VIEW COMPARISON:  08/11/2015. FINDINGS: Endotracheal tube and NG tube in stable position. Cardiomegaly with diffuse bilateral pulmonary infiltrates consistent with pulmonary edema and/or pneumonia. No interim clearing. Bilateral pleural effusions. No pneumothorax. IMPRESSION: 1.  Lines and tubes in stable position. 2. Cardiomegaly with diffuse bilateral pulmonary alveolar infiltrates and pleural effusions consistent with congestive heart failure. No significant change from prior exam. Bilateral pneumonia could also present in this fashion. Electronically Signed   By: Maisie Fus  Register   On: 08/11/2015 13:29   Dg  Chest Port 1 View  08/11/2015  CLINICAL DATA:  Endotracheal tube EXAM: PORTABLE CHEST 1 VIEW COMPARISON:  08/09/2015 FINDINGS: Endotracheal tube in unchanged position. Enteric tube again identified as well. Cardiac enlargement stable. Vascular congestion. Indistinct perihilar vasculature. Hazy opacity lower 2/3 left lung and lower 1/3 right lung. IMPRESSION: Increased bilateral opacities suggest significant congestive heart failure with pulmonary edema and probably bilateral pleural effusions. Electronically Signed   By: Esperanza Heir M.D.   On: 08/11/2015 10:08    STUDIES:  CT Chest 5/26 (OSH): no evidence of PE, extensive atx of LUL, lingula, bilateral lower  lobes, small bilateral effusions, 4.5 cm ascending thoracic aortic aneurysm recommended for semi-annual follow up with Thoracic Surgery ECHO 5/26: LVEF 60-65%, no wall motion, grade 1 diastolic dysfunction, mild aortic root dilation RUQ [place] 5/28: Hepatic steatosis w/ patent porta vein & normal flow. No biliary ductal dilation.  CULTURES: OSH  UC 5/22:  Negative BCx2 5/21:  Negative  Tracheal Aspirate 5/21: Normal Oral Flora  BCx2 5/26 >>> Tracheal Aspirate 5/26:  Oral Flora  U. Strep 5/26:  Negative U. Legionella 5/26:  Negative   UrC 5/29>>res enteroccus BC x2 5/30>>coag neg staph Trach Asp 5/30>>  ANTIBIOTICS: vanc 5/31>>> Imiepenem 5/31>>>  SIGNIFICANT EVENTS: 5/21  Admit to [facility] with respiratory distress, intubated, thought CAP, +/- CHF  5/28 collapse left lung  LINES/TUBES: ETT 5/21 >> exchanged 5/26 >>  OGT >> Foley >> PIV x2  ASSESSMENT / PLAN:  PULMONARY A: Acute Hypoxemic Respiratory Failure - Suspect pulmonary edema from diastolic CHF. CAP - Unlikely. Cultures negative so far. Bilateral Pleural Effusions Suspected COPD  Possible Aspiration Event - pm 5/27 with vomiting / OGT coiled in mouth Collapse 5/26 - resolved without intervention  P:   Did have neg ba;ance finally 600 cc Get pcxr now and in am  Goal to 50% if able cpap 5 ps 5, 60% attempt Upright WUA  CARDIOVASCULAR A:  Acute Diastolic CHF  H/O HTN & HLD Elevated Troponin I - Likely demand ischemia. Atherosclerotic CAD - Noted on CT scan. Ascending Thoracic Aneurysm - Noted on 5/26 CT at [facility]    P:  lasix to neg balance further, did well, tolerated tele  RENAL A:   Pos balance daily hypernatremia P:   Lasix to 120 q8h Add metolazone Was neg barely, goal neg 2 liters Chem tolerated well, repeat   GASTROINTESTINAL A:   Morbid Obesity  Transaminitis - Likely congestive hepatopathy w/ CHF.  Hepatic Steatosis/Early Cirrhosis - Seen on RUQ U/S. Prior h/o EtOH  stopped 1990s. No BM  P:   Continue TF Protonix VT daily Senna VT bid mirilax Give enema if lactulose not help dulx supp  HEMATOLOGIC A:   Leukocytosis DVt prevention  P:  Heparin [place] q8hr maintain SCDs Get coags in am   INFECTIOUS A:   CAUTI? P:   Appears that enterococcus explained fevers Nothing new growing sputum, dc imipenem Maintain vanc, 10 days  Change foley 6/1 done  ENDOCRINE A:   DM II  - Blood glucose uncontrolled but improved  P:   Accu-Checks q4hr SSI per Resistant Algorithm NPH 55u [place] to q12h TF coverage to 5  NEUROLOGIC A:   Acute Encephalopathy - Multifactorial w/ hypoxia.  P:   RASS goal: -1 Fentanyl gtt wua Versed drip off 6/1, prn versed Seroquel qhs 100 Light son daily, shades open!!!  FAMILY  - Updates:  Daughters updated daily  - Inter-disciplinary family meet or Palliative Care meeting due by:  6/1  Ccm time 30 min   Mcarthur Rossettianiel J. Tyson AliasFeinstein, MD, FACP Pgr: [phone] [facility]

## 2015-08-12 NOTE — Progress Notes (Addendum)
Wasted 40mcg of Fentanyl with Dondra SpryBryce Shephard, RN.

## 2015-08-12 NOTE — Progress Notes (Signed)
eLink Physician-Brief Progress Note Patient Name: Zachary Blevins DOB: 07-Sep-1945 MRN: [id number]   Date of Service  08/12/2015  HPI/Events of Note  Patient very agitated  eICU Interventions  Increased Versed 2 mg every 1 hr as needed Will start propofol if needed Soft wrist restraints     Intervention Category Minor Interventions: Agitation / anxiety - evaluation and management  Dalis Beers 08/12/2015, 10:50 PM

## 2015-08-12 NOTE — Progress Notes (Signed)
CRITICAL VALUE ALERT  Critical value received:  CO2 59.4  Date of notification:  08/12/15  Time of notification:  2317  Critical value read back:Yes.    Nurse who received alert:  Remonia RichterLindsey Walsh RN  MD notified (1st page):  eLink MD  Time of first page:  2318  Responding MD:  Pola CorneLink MD  Time MD responded:  [id number]

## 2015-08-12 NOTE — Progress Notes (Signed)
Pharmacy Antibiotic Note  Zachary Blevins is a 70 y.o. male admitted on 07/19/2015 with pneumonia.  Pharmacy has been consulted for vancomycin dosing.  Vancomycin trough 25, supratherapeutic this AM. Drawn 7h post dose at steady state.   Plan: Vancomycin 1000 IV every 12 hours.  Goal trough 15-20 mcg/mL. Monitor renal function, clinical status VT once at steady state of new dose  Height: 5\' 11"  (180.3 cm) Weight: (!) 302 lb 0.5 oz (137 kg) IBW/kg (Calculated) : 75.3  Temp (24hrs), Avg:98.9 F (37.2 C), Min:98.5 F (36.9 C), Max:100.2 F (37.9 C)   Recent Labs Lab 07/17/2015 1719  08/08/15 0241 08/09/15 0323 08/10/15 0240 08/11/15 0330 08/12/15 0357 08/12/15 0912  WBC  --   < > 12.0* 13.1* 12.6* 11.9* 13.7*  --   CREATININE  --   < > 1.24 1.16 1.14 1.17 1.12  --   LATICACIDVEN 1.9  --   --   --   --   --   --   --   VANCOTROUGH  --   --   --   --   --   --   --  25*  < > = values in this interval not displayed.  Estimated Creatinine Clearance: 85.6 mL/min (by C-G formula based on Cr of 1.12).    Allergies  Allergen Reactions  . Codeine Nausea And Vomiting    Antimicrobials this admission: 5/31 Vancomycin >>  5/31 Imipenem >>   Dose adjustments this admission: 6/2 decreased vancomycin to 1000 mg q12h  Microbiology results: 5/30 BCx: 1/2 MRSE 5/29 UCx:  5/30 Sputum: normal flora 5/26 MRSA PCR: negative  Thank you for allowing pharmacy to be a part of this patient's care.  Floria Ravelinglizabeth T Gwyn Mehring 08/12/2015 1:39 PM

## 2015-08-12 NOTE — Care Management Important Message (Signed)
Important Message  Patient Details  Name: Zachary Blevins MRN: [id number] Date of Birth: [birth year]   Medicare Important Message Given:  Yes    Bernadette HoitShoffner, Dameshia Seybold Coleman 08/12/2015, 9:07 AM

## 2015-08-12 NOTE — Progress Notes (Signed)
ABG results given to RN. 

## 2015-08-12 NOTE — Progress Notes (Signed)
Nutrition Follow-up  DOCUMENTATION CODES:   Morbid obesity  INTERVENTION:    Continue Vital High Protein at goal rate of 75 ml/h (1800 ml per day) to provide 1800 kcals, 157 gm protein, 1512 ml free water daily.  NUTRITION DIAGNOSIS:   Inadequate oral intake related to inability to eat as evidenced by NPO status.  Ongoing  GOAL:   Provide needs based on ASPEN/SCCM guidelines  Met  MONITOR:   TF tolerance, Skin, I & O's, Labs, Weight trends, Vent status  ASSESSMENT:   70 y/o M with PMH of DM, HTN, COPD and recent admit for CAP admitted 5/21 to [facility] with acute respiratory failure. Progressed to hypercarbic failure and intubated. Tx to [facility] 5/26. Concern for possible HCAP with recent admit in March 2017 and CHF exacerbation with layering pleural effusions.   Patient remains intubated on ventilator support Temp (24hrs), Avg:98.9 F (37.2 C), Min:98.5 F (36.9 C), Max:100.2 F (37.9 C) Weight stable. Patient tolerating TF well, but has not had a BM since admission. Patient receiving Lactulose and Miralax. Labs reviewed. Weight stable.  Diet Order:  Diet NPO time specified  Skin:  Reviewed, no issues  Last BM:  none since admission  Height:   Ht Readings from Last 1 Encounters:  08/04/2015 5' 11"  (1.803 m)    Weight:   Wt Readings from Last 1 Encounters:  08/12/15 302 lb 0.5 oz (137 kg)   08/06/15 304 lb 0.2 oz (137.9 kg)         Ideal Body Weight:  78.2 kg  BMI:  Body mass index is 42.14 kg/(m^2).  Estimated Nutritional Needs:   Kcal:  0211-1735  Protein:  156-195 grams  Fluid:  per MD  EDUCATION NEEDS:   No education needs identified at this time  Molli Barrows, [place], [place], [place] Pager [phone] After Hours Pager [phone]

## 2015-08-12 NOTE — Progress Notes (Signed)
eLink Physician-Brief Progress Note Patient Name: Zachary Blevins DOB: [birth year] MRN: [id number]   Date of Service  08/12/2015  HPI/Events of Note  Worsening hypoxia, fio2 increased to 80%  eICU Interventions  Last abg 5/27 will obtain ABG STAT, increase PEEp to 10, get CXR     Intervention Category Major Interventions: Hypoxemia - evaluation and management  Erin FullingKurian Lyssa Hackley 08/12/2015, 9:48 PM

## 2015-08-13 ENCOUNTER — Inpatient Hospital Stay (HOSPITAL_COMMUNITY): Payer: Medicare Other

## 2015-08-13 LAB — COMPREHENSIVE METABOLIC PANEL
ALBUMIN: 2.1 g/dL — AB (ref 3.5–5.0)
ALK PHOS: 244 U/L — AB (ref 38–126)
ALT: 118 U/L — AB (ref 17–63)
AST: 65 U/L — ABNORMAL HIGH (ref 15–41)
Anion gap: 11 (ref 5–15)
BILIRUBIN TOTAL: 1.1 mg/dL (ref 0.3–1.2)
BUN: 45 mg/dL — AB (ref 6–20)
CALCIUM: 9.2 mg/dL (ref 8.9–10.3)
CO2: 39 mmol/L — AB (ref 22–32)
CREATININE: 1.06 mg/dL (ref 0.61–1.24)
Chloride: 91 mmol/L — ABNORMAL LOW (ref 101–111)
GFR calc Af Amer: >60 mL/min (ref >60)
GFR calc non Af Amer: >60 mL/min (ref >60)
GLUCOSE: 304 mg/dL — AB (ref 65–99)
Potassium: 3.6 mmol/L (ref 3.5–5.1)
SODIUM: 141 mmol/L (ref 135–145)
Total Protein: 5.8 g/dL — ABNORMAL LOW (ref 6.5–8.1)

## 2015-08-13 LAB — CBC WITH DIFFERENTIAL/PLATELET
BASOS ABS: 0 10*3/uL (ref 0.0–0.1)
BASOS PCT: 0 %
Eosinophils Absolute: 0.5 10*3/uL (ref 0.0–0.7)
Eosinophils Relative: 4 %
HEMATOCRIT: 47.1 % (ref 39.0–52.0)
HEMOGLOBIN: 14.1 g/dL (ref 13.0–17.0)
LYMPHS ABS: 1.6 10*3/uL (ref 0.7–4.0)
LYMPHS PCT: 13 %
MCH: 28.7 pg (ref 26.0–34.0)
MCHC: 29.9 g/dL — ABNORMAL LOW (ref 30.0–36.0)
MCV: 95.7 fL (ref 78.0–100.0)
MONO ABS: 0.7 10*3/uL (ref 0.1–1.0)
MONOS PCT: 6 %
Neutro Abs: 9.7 10*3/uL — ABNORMAL HIGH (ref 1.7–7.7)
Neutrophils Relative %: 78 %
Platelets: 199 10*3/uL (ref 150–400)
RBC: 4.92 MIL/uL (ref 4.22–5.81)
RDW: 13.8 % (ref 11.5–15.5)
WBC: 12.4 10*3/uL — ABNORMAL HIGH (ref 4.0–10.5)

## 2015-08-13 LAB — GLUCOSE, CAPILLARY
GLUCOSE-CAPILLARY: 282 mg/dL — AB (ref 65–99)
GLUCOSE-CAPILLARY: 289 mg/dL — AB (ref 65–99)
GLUCOSE-CAPILLARY: 291 mg/dL — AB (ref 65–99)
GLUCOSE-CAPILLARY: 323 mg/dL — AB (ref 65–99)
GLUCOSE-CAPILLARY: 320 mg/dL — AB (ref 65–99)
Glucose-Capillary: 272 mg/dL — ABNORMAL HIGH (ref 65–99)

## 2015-08-13 LAB — PROTIME-INR
INR: 1.11 (ref 0.00–1.49)
Prothrombin Time: 14.5 seconds (ref 11.6–15.2)

## 2015-08-13 LAB — MAGNESIUM: Magnesium: 2 mg/dL (ref 1.7–2.4)

## 2015-08-13 LAB — PHOSPHORUS: Phosphorus: 4.8 mg/dL — ABNORMAL HIGH (ref 2.5–4.6)

## 2015-08-13 LAB — TRIGLYCERIDES: TRIGLYCERIDES: 235 mg/dL — AB (ref <150)

## 2015-08-13 MED ORDER — SODIUM CHLORIDE 0.9 % IV SOLN
INTRAVENOUS | Status: DC
  Administered 2015-08-13: 3.2 [IU]/h via INTRAVENOUS
  Filled 2015-08-13: qty 2.5

## 2015-08-13 MED ORDER — IPRATROPIUM-ALBUTEROL 0.5-2.5 (3) MG/3ML IN SOLN
3.0000 mL | Freq: Four times a day (QID) | RESPIRATORY_TRACT | Status: DC
  Administered 2015-08-13 – 2015-08-15 (×10): 3 mL via RESPIRATORY_TRACT
  Filled 2015-08-13 (×10): qty 3

## 2015-08-13 MED ORDER — PIPERACILLIN-TAZOBACTAM 3.375 G IVPB 30 MIN
3.3750 g | Freq: Once | INTRAVENOUS | Status: AC
  Administered 2015-08-13: 3.375 g via INTRAVENOUS
  Filled 2015-08-13: qty 50

## 2015-08-13 MED ORDER — PROPOFOL 1000 MG/100ML IV EMUL
5.0000 ug/kg/min | INTRAVENOUS | Status: DC
  Administered 2015-08-13: 5 ug/kg/min via INTRAVENOUS
  Filled 2015-08-13: qty 100

## 2015-08-13 MED ORDER — PROPOFOL 1000 MG/100ML IV EMUL
5.0000 ug/kg/min | INTRAVENOUS | Status: DC
  Administered 2015-08-13 (×2): 30 ug/kg/min via INTRAVENOUS
  Administered 2015-08-13: 5 ug/kg/min via INTRAVENOUS
  Administered 2015-08-13: 30 ug/kg/min via INTRAVENOUS
  Administered 2015-08-14: 25 ug/kg/min via INTRAVENOUS
  Administered 2015-08-14: 40 ug/kg/min via INTRAVENOUS
  Administered 2015-08-14: 30 ug/kg/min via INTRAVENOUS
  Administered 2015-08-14: 40 ug/kg/min via INTRAVENOUS
  Administered 2015-08-14: 35 ug/kg/min via INTRAVENOUS
  Administered 2015-08-15 (×2): 45 ug/kg/min via INTRAVENOUS
  Administered 2015-08-15: 50 ug/kg/min via INTRAVENOUS
  Administered 2015-08-15: 45 ug/kg/min via INTRAVENOUS
  Filled 2015-08-13 (×11): qty 100
  Filled 2015-08-13: qty 200

## 2015-08-13 MED ORDER — PIPERACILLIN-TAZOBACTAM 3.375 G IVPB
3.3750 g | Freq: Three times a day (TID) | INTRAVENOUS | Status: DC
  Administered 2015-08-13 – 2015-08-15 (×7): 3.375 g via INTRAVENOUS
  Filled 2015-08-13 (×8): qty 50

## 2015-08-13 NOTE — Progress Notes (Signed)
eLink Physician-Brief Progress Note Patient Name: Zachary Blevins DOB: Feb 12, 1946 MRN: [id number]   Date of Service  08/13/2015  HPI/Events of Note  Patient with worsening hypoxia, CXR reports worsening infiltrate-unable to see films  eICU Interventions  Edema vs pneumonia Will add zosyn to regimen     Intervention Category Major Interventions: Respiratory failure - evaluation and management  Firas Guardado 08/13/2015, 1:10 AM

## 2015-08-13 NOTE — Progress Notes (Signed)
To add  Zosyn to vancomycin for HCAP. Plan: zosyn 3.375 gm IV x 1 dose over 30 min then zosyn 3.375 gm IV q8h, infuse each dose over 4 hours Herby AbrahamMichelle T. Dilana Mcphie, Pharm.D. [phone] 08/13/2015 1:26 AM

## 2015-08-13 NOTE — Progress Notes (Addendum)
PULMONARY / CRITICAL CARE MEDICINE   Name: Zachary Blevins MRN: [id number] DOB: 11-Oct-1945    ADMISSION DATE:  August 25, 2015 CONSULTATION DATE:  August 25, 2015  REFERRING MD:  [facility]   CHIEF COMPLAINT:  Acute Respiratory Failure   SUBJECTIVE:  Higher PEEP. More agitated today. Diuresing well.  VITAL SIGNS: BP 125/66 mmHg  Pulse 89  Temp(Src) 99.4 F (37.4 C) (Oral)  Resp 18  Ht  (1.803 m)  Wt 299 lb 2.6 oz (135.7 kg)  BMI 41.74 kg/m2  SpO2 98%  HEMODYNAMICS:    VENTILATOR SETTINGS: Vent Mode:  [-] PRVC FiO2 (%):  [60 %-80 %] 70 % Set Rate:  [18 bmp] 18 bmp Vt Set:  [600 mL] 600 mL PEEP:  [5 cmH20-10 cmH20] 10 cmH20 Plateau Pressure:  [20 cmH20-25 cmH20] 25 cmH20  INTAKE / OUTPUT: I/O last 3 completed shifts: In: 6169 [I.V.:2428; NG/GT:2805; IV Piggyback:936] Out: 9975 [Urine:9975]  PHYSICAL EXAMINATION: General:  Morbidly obese, No distress Neuro:  No focal deficits HEENT:  No thyromegaly, JVD Cardiovascular:  S1, S2, RRR, No MRG Lungs: B/L rhonchi, + BS Abdomen:  Obese, Soft. Low BS. Integument:  Warm & dry. No rash.  LABS:  BMET  Recent Labs Lab 08/11/15 0330 08/12/15 0357 08/13/15 0425  NA 145 144 141  K 4.0 3.9 3.6  CL 105 102 91*  CO2 33* 35* 39*  BUN 46* 44* 45*  CREATININE 1.17 1.12 1.06  GLUCOSE 325* 258* 304*    Electrolytes  Recent Labs Lab 08/11/15 0330 08/12/15 0357 08/13/15 0425  CALCIUM 8.3* 8.5* 9.2  MG 2.4 2.0 2.0  PHOS 2.8 3.8 4.8*    CBC  Recent Labs Lab 08/11/15 0330 08/12/15 0357 08/13/15 0425  WBC 11.9* 13.7* 12.4*  HGB 14.1 13.2 14.1  HCT 47.8 44.2 47.1  PLT 204 222 199    Coag's  Recent Labs Lab 08/13/15 0425  INR 1.11    Sepsis Markers  Recent Labs Lab 08/09/15 1136 08/10/15 0240 08/11/15 0330  PROCALCITON 0.21 0.25 0.25    ABG  Recent Labs Lab 08/12/15 2317  PHART 7.441  PCO2ART 59.9*  PO2ART 54.0*    Liver Enzymes  Recent Labs Lab 08/11/15 0330 08/12/15 0357  08/13/15 0425  AST 31 62* 65*  ALT 55 76* 118*  ALKPHOS 130* 188* 244*  BILITOT 0.9 1.1 1.1  ALBUMIN 2.1* 2.0* 2.1*    Cardiac Enzymes No results for input(s): TROPONINI, PROBNP in the last 168 hours.  Glucose  Recent Labs Lab 08/12/15 1549 08/12/15 1914 08/12/15 2308 08/13/15 0324 08/13/15 0859 08/13/15 1242  GLUCAP 193* 250* 272* 282* 289* 291*    Imaging - IMAGES PERSONALLY REVIEWED Dg Chest Port 1 View  08/13/2015  CLINICAL DATA:  Acute respiratory failure. Worsening hypoxia. On ventilator. EXAM: PORTABLE CHEST 1 VIEW COMPARISON:  08/12/2015 FINDINGS: Endotracheal tube tip remains approximately 5 cm above the carina. Nasogastric tube is seen entering the stomach. Diffuse mixed interstitial and airspace disease shows mild worsening in the right mid and lower lung fields. There is persistent left retrocardiac atelectasis or consolidation, and left pleural effusion cannot be excluded. No pneumothorax visualized. IMPRESSION: Mild worsening of right lung airspace disease. Persistent left retrocardiac atelectasis or consolidation; cannot exclude left pleural effusion. Electronically Signed   By: Myles Rosenthal M.D.   On: 08/13/2015 00:07   US Abdomen Limited Ruq  08/13/2015  CLINICAL DATA:  Elevated liver function tests. EXAM: US ABDOMEN LIMITED - RIGHT UPPER QUADRANT COMPARISON:  08/07/2015 FINDINGS: Suboptimal examination due  to patient body habitus and ventilated state. Gallbladder: Surgically absent. Common bile duct: Diameter: 6 mm Liver: Diffusely heterogeneous echotexture and mildly increased echogenicity diffusely, similar to the recent prior study. No gross focal abnormality identified. IMPRESSION: 1. Similar appearance of diffuse nonspecific liver heterogeneity. 2. Prior cholecystectomy without biliary dilatation. Electronically Signed   By: Sebastian AcheAllen  Grady M.D.   On: 08/13/2015 12:55    STUDIES:  CT Chest 5/26 (OSH): no evidence of PE, extensive atx of LUL, lingula, bilateral lower  lobes, small bilateral effusions, 4.5 cm ascending thoracic aortic aneurysm recommended for semi-annual follow up with Thoracic Surgery ECHO 5/26: LVEF 60-65%, no wall motion, grade 1 diastolic dysfunction, mild aortic root dilation RUQ US 5/28: Hepatic steatosis w/ patent porta vein & normal flow. No biliary ductal dilation.  CULTURES: OSH  UC 5/22:  Negative BCx2 5/21:  Negative  Tracheal Aspirate 5/21: Normal Oral Flora  BCx2 5/26 >>> Tracheal Aspirate 5/26:  Oral Flora  U. Strep 5/26:  Negative U. Legionella 5/26:  Negative   UrC 5/29>>res enteroccus BC x2 5/30>>coag neg staph Trach Asp 5/30>>  ANTIBIOTICS: vanc 5/31>>> Imiepenem 5/31>>>  SIGNIFICANT EVENTS: 5/21  [facility] with respiratory distress, intubated, thought CAP, +/- CHF  5/28 collapse left lung  LINES/TUBES: ETT 5/21 >> exchanged 5/26 >>  OGT >> Foley >> PIV x2  ASSESSMENT / PLAN:  PULMONARY A: Acute Hypoxemic Respiratory Failure - Suspect pulmonary edema from diastolic CHF. CAP - Unlikely. Cultures negative so far. Bilateral Pleural Effusions Suspected COPD  Possible Aspiration Event - pm 5/27 with vomiting / OGT coiled in mouth Collapse 5/26 - resolved without intervention  P:   Wean PEEP FiO2 Diurese with lasix Likely heading towards trach. CXR with worsening opacities   CARDIOVASCULAR A:  Acute Diastolic CHF  H/O HTN & HLD Elevated Troponin I - Likely demand ischemia. Atherosclerotic CAD - Noted on CT scan. Ascending Thoracic Aneurysm - [facility]    P:  Lasix for diuresis  RENAL A:   Pos balance daily hypernatremia P:   Monitor I/O  GASTROINTESTINAL A:   Morbid Obesity  Transaminitis - Likely congestive hepatopathy w/ CHF.  Hepatic Steatosis/Early Cirrhosis - Seen on RUQ U/S. Prior h/o EtOH stopped 1990s. No BM  P:   Continue TF Protonix VT daily Senna VT bid   HEMATOLOGIC A:   Leukocytosis DVT prevention  P:  Heparin [place] for  DVT prophylaxis Get coags in am   INFECTIOUS A:   CAUTI? P:   Appears that enterococcus explained fevers Nothing new growing sputum, dc imipenem Maintain vanc, 10 days  Change foley 6/1 done  ENDOCRINE A:   DM II  - Blood glucose uncontrolled but improved  P:   Accu-Checks q4hr SSI per Resistant Algorithm NPH 55u [place] to q12h TF coverage to 5  NEUROLOGIC A:   Acute Encephalopathy - Multifactorial w/ hypoxia.  P:   RASS goal: -1 Fentanyl gtt wua Versed drip off 6/1, prn versed Start propofol for altered mental status. Seroquel qhs 100  FAMILY  - Updates:  Daughters today. We discussed trach. They are unsure about it. - Inter-disciplinary family meet or Palliative Care meeting due by:  6/1  Ccm time 30 min   Chilton GreathousePraveen Bernetta Sutley MD [place] [facility] Pager [phone] If no answer or after 3pm call: [phone] 08/13/2015, 1:52 PM

## 2015-08-14 ENCOUNTER — Inpatient Hospital Stay (HOSPITAL_COMMUNITY): Payer: Medicare Other

## 2015-08-14 LAB — COMPREHENSIVE METABOLIC PANEL
ALK PHOS: 242 U/L — AB (ref 38–126)
ALT: 106 U/L — AB (ref 17–63)
AST: 46 U/L — AB (ref 15–41)
Albumin: 2 g/dL — ABNORMAL LOW (ref 3.5–5.0)
Anion gap: 13 (ref 5–15)
BUN: 59 mg/dL — AB (ref 6–20)
CALCIUM: 9.1 mg/dL (ref 8.9–10.3)
CHLORIDE: 86 mmol/L — AB (ref 101–111)
CO2: 40 mmol/L — ABNORMAL HIGH (ref 22–32)
CREATININE: 1.4 mg/dL — AB (ref 0.61–1.24)
GFR calc Af Amer: 57 mL/min — ABNORMAL LOW (ref >60)
GFR, EST NON AFRICAN AMERICAN: 49 mL/min — AB (ref >60)
Glucose, Bld: 319 mg/dL — ABNORMAL HIGH (ref 65–99)
Potassium: 2.9 mmol/L — ABNORMAL LOW (ref 3.5–5.1)
Sodium: 139 mmol/L (ref 135–145)
Total Bilirubin: 1.3 mg/dL — ABNORMAL HIGH (ref 0.3–1.2)
Total Protein: 5.8 g/dL — ABNORMAL LOW (ref 6.5–8.1)

## 2015-08-14 LAB — GLUCOSE, CAPILLARY
GLUCOSE-CAPILLARY: 295 mg/dL — AB (ref 65–99)
GLUCOSE-CAPILLARY: 318 mg/dL — AB (ref 65–99)
GLUCOSE-CAPILLARY: 348 mg/dL — AB (ref 65–99)
GLUCOSE-CAPILLARY: 378 mg/dL — AB (ref 65–99)
GLUCOSE-CAPILLARY: 388 mg/dL — AB (ref 65–99)
GLUCOSE-CAPILLARY: 487 mg/dL — AB (ref 65–99)
GLUCOSE-CAPILLARY: 513 mg/dL — AB (ref 65–99)
Glucose-Capillary: 377 mg/dL — ABNORMAL HIGH (ref 65–99)
Glucose-Capillary: 385 mg/dL — ABNORMAL HIGH (ref 65–99)
Glucose-Capillary: 272 mg/dL — ABNORMAL HIGH (ref 65–99)
Glucose-Capillary: 266 mg/dL — ABNORMAL HIGH (ref 65–99)
Glucose-Capillary: 244 mg/dL — ABNORMAL HIGH (ref 65–99)
Glucose-Capillary: 182 mg/dL — ABNORMAL HIGH (ref 65–99)
Glucose-Capillary: 176 mg/dL — ABNORMAL HIGH (ref 65–99)

## 2015-08-14 LAB — BASIC METABOLIC PANEL
ANION GAP: 13 (ref 5–15)
BUN: 76 mg/dL — ABNORMAL HIGH (ref 6–20)
CALCIUM: 8.7 mg/dL — AB (ref 8.9–10.3)
CO2: 39 mmol/L — AB (ref 22–32)
Chloride: 82 mmol/L — ABNORMAL LOW (ref 101–111)
Creatinine, Ser: 1.54 mg/dL — ABNORMAL HIGH (ref 0.61–1.24)
GFR, EST AFRICAN AMERICAN: 51 mL/min — AB (ref >60)
GFR, EST NON AFRICAN AMERICAN: 44 mL/min — AB (ref >60)
GLUCOSE: 525 mg/dL — AB (ref 65–99)
POTASSIUM: 3.9 mmol/L (ref 3.5–5.1)
Sodium: 134 mmol/L — ABNORMAL LOW (ref 135–145)

## 2015-08-14 LAB — CBC WITH DIFFERENTIAL/PLATELET
BASOS PCT: 0 %
Basophils Absolute: 0 10*3/uL (ref 0.0–0.1)
Eosinophils Absolute: 0.5 10*3/uL (ref 0.0–0.7)
Eosinophils Relative: 4 %
HEMATOCRIT: 45.1 % (ref 39.0–52.0)
Hemoglobin: 13.9 g/dL (ref 13.0–17.0)
LYMPHS PCT: 11 %
Lymphs Abs: 1.4 10*3/uL (ref 0.7–4.0)
MCH: 28 pg (ref 26.0–34.0)
MCHC: 30.8 g/dL (ref 30.0–36.0)
MCV: 90.9 fL (ref 78.0–100.0)
MONO ABS: 0.5 10*3/uL (ref 0.1–1.0)
MONOS PCT: 4 %
NEUTROS ABS: 10.1 10*3/uL — AB (ref 1.7–7.7)
Neutrophils Relative %: 81 %
Platelets: 183 10*3/uL (ref 150–400)
RBC: 4.96 MIL/uL (ref 4.22–5.81)
RDW: 13.8 % (ref 11.5–15.5)
WBC: 12.5 10*3/uL — ABNORMAL HIGH (ref 4.0–10.5)

## 2015-08-14 LAB — BLOOD GAS, ARTERIAL
Acid-Base Excess: 15.7 mmol/L — ABNORMAL HIGH (ref 0.0–2.0)
Bicarbonate: 40.7 mEq/L — ABNORMAL HIGH (ref 20.0–24.0)
Drawn by: 245131
FIO2: 60
MECHVT: 600 mL
O2 Saturation: 86.3 %
PATIENT TEMPERATURE: 98.8
PCO2 ART: 58.8 mmHg — AB (ref 35.0–45.0)
PEEP: 10 cmH2O
PO2 ART: 54.9 mmHg — AB (ref 80.0–100.0)
RATE: 18 resp/min
TCO2: 42.5 mmol/L (ref 0–100)
pH, Arterial: 7.455 — ABNORMAL HIGH (ref 7.350–7.450)

## 2015-08-14 LAB — CULTURE, BLOOD (ROUTINE X 2): Culture: NO GROWTH

## 2015-08-14 LAB — MAGNESIUM: MAGNESIUM: 2.2 mg/dL (ref 1.7–2.4)

## 2015-08-14 LAB — PHOSPHORUS: Phosphorus: 5.9 mg/dL — ABNORMAL HIGH (ref 2.5–4.6)

## 2015-08-14 MED ORDER — ANTISEPTIC ORAL RINSE SOLUTION (CORINZ)
7.0000 mL | Freq: Four times a day (QID) | OROMUCOSAL | Status: DC
  Administered 2015-08-15 (×2): 7 mL via OROMUCOSAL

## 2015-08-14 MED ORDER — POTASSIUM CHLORIDE 10 MEQ/50ML IV SOLN
10.0000 meq | INTRAVENOUS | Status: AC
  Administered 2015-08-14 (×8): 10 meq via INTRAVENOUS
  Filled 2015-08-14 (×2): qty 50

## 2015-08-14 NOTE — Progress Notes (Signed)
CRITICAL VALUE ALERT  Critical value received: ABG CO2  Date of notification:  08/14/15  Time of notification:  0411  Critical value read back:Yes.    Nurse who received alert:  Abe PeopleStephanie Yancarlos Berthold  MD notified (1st page):  Ramaswamy  Time of first page:  0411  MD notified (2nd page):  Time of second page:  Responding MD:  Marchelle Gearingamaswamy  Time MD responded:  [phone]

## 2015-08-14 NOTE — Progress Notes (Signed)
PULMONARY / CRITICAL CARE MEDICINE   Name: Zachary Blevins MRN: [id number] DOB: 01-06-1946    ADMISSION DATE:  08/04/2015 CONSULTATION DATE:  07/15/2015  REFERRING MD:  [facility]   CHIEF COMPLAINT:  Acute Respiratory Failure   SUBJECTIVE: [name] lung function inspite of diuresis  VITAL SIGNS: BP 102/58 mmHg  Pulse 76  Temp(Src) 98.7 F (37.1 C) (Oral)  Resp 18  Ht 5\' 11"  (1.803 m)  Wt 304 lb 10.8 oz (138.2 kg)  BMI 42.51 kg/m2  SpO2 95%  HEMODYNAMICS: CVP:  [7 mmHg] 7 mmHg  VENTILATOR SETTINGS: Vent Mode:  [-] PRVC FiO2 (%):  [60 %-100 %] 100 % Set Rate:  [18 bmp] 18 bmp Vt Set:  [600 mL] 600 mL PEEP:  [10 cmH20] 10 cmH20 Plateau Pressure:  [22 cmH20-25 cmH20] 24 cmH20  INTAKE / OUTPUT: I/O last 3 completed shifts: In: 6050.5 [I.V.:2518.5; Other:185; NG/GT:2025; IV Piggyback:1322] Out: 8055 [Urine:8055]  PHYSICAL EXAMINATION: General:  Morbidly obese, No distress Neuro:  No focal deficits HEENT:  No thyromegaly, JVD Cardiovascular:  S1, S2, RRR, No MRG Lungs: B/L rhonchi, + BS Abdomen:  Obese, Soft. Low BS. Integument:  Warm & dry. No rash.  LABS:  BMET  Recent Labs Lab 08/12/15 0357 08/13/15 0425 08/14/15 0245  NA 144 141 139  K 3.9 3.6 2.9*  CL 102 91* 86*  CO2 35* 39* 40*  BUN 44* 45* 59*  CREATININE 1.12 1.06 1.40*  GLUCOSE 258* 304* 319*    Electrolytes  Recent Labs Lab 08/12/15 0357 08/13/15 0425 08/14/15 0245  CALCIUM 8.5* 9.2 9.1  MG 2.0 2.0 2.2  PHOS 3.8 4.8* 5.9*    CBC  Recent Labs Lab 08/12/15 0357 08/13/15 0425 08/14/15 0245  WBC 13.7* 12.4* 12.5*  HGB 13.2 14.1 13.9  HCT 44.2 47.1 45.1  PLT 222 199 183    Coag's  Recent Labs Lab 08/13/15 0425  INR 1.11    Sepsis Markers  Recent Labs Lab 08/09/15 1136 08/10/15 0240 08/11/15 0330  PROCALCITON 0.21 0.25 0.25    ABG  Recent Labs Lab 08/12/15 2317 08/14/15 0341  PHART 7.441 7.455*  PCO2ART 59.9* 58.8*  PO2ART 54.0* 54.9*    Liver  Enzymes  Recent Labs Lab 08/12/15 0357 08/13/15 0425 08/14/15 0245  AST 62* 65* 46*  ALT 76* 118* 106*  ALKPHOS 188* 244* 242*  BILITOT 1.1 1.1 1.3*  ALBUMIN 2.0* 2.1* 2.0*    Cardiac Enzymes No results for input(s): TROPONINI, PROBNP in the last 168 hours.  Glucose  Recent Labs Lab 08/13/15 2209 08/13/15 2308 08/14/15 0014 08/14/15 0115 08/14/15 0215 08/14/15 0317  GLUCAP 377* 388* 348* 378* 385* 318*    Imaging - IMAGES PERSONALLY REVIEWED Dg Chest Port 1 View  08/14/2015  CLINICAL DATA:  Acute respiratory failure EXAM: PORTABLE CHEST 1 VIEW COMPARISON:  08/12/2015 FINDINGS: Support devices are stable. Cardiomegaly with vascular congestion and bilateral airspace disease again noted. No real change since prior study. Possible layering effusions. IMPRESSION: No significant change in bilateral airspace opacities and possible layering effusions. Continued cardiomegaly and vascular congestion. Electronically Signed   By: Zachary Blevins M.D.   On: 08/14/2015 07:39   US Abdomen Limited Ruq  08/13/2015  CLINICAL DATA:  Elevated liver function tests. EXAM: US ABDOMEN LIMITED - RIGHT UPPER QUADRANT COMPARISON:  08/07/2015 FINDINGS: Suboptimal examination due to patient body habitus and ventilated state. Gallbladder: Surgically absent. Common bile duct: Diameter: 6 mm Liver: Diffusely heterogeneous echotexture and mildly increased echogenicity diffusely, similar to the  recent prior study. No gross focal abnormality identified. IMPRESSION: 1. Similar appearance of diffuse nonspecific liver heterogeneity. 2. Prior cholecystectomy without biliary dilatation. Electronically Signed   By: Zachary Blevins  Zachary M.D.   On: 08/13/2015 12:55    STUDIES:  CT Chest 5/26 (OSH): no evidence of PE, extensive atx of LUL, lingula, bilateral lower lobes, small bilateral effusions, 4.5 cm ascending thoracic aortic aneurysm recommended for semi-annual follow up with Thoracic Surgery ECHO 5/26: LVEF 60-65%, no wall  motion, grade 1 diastolic dysfunction, mild aortic root dilation RUQ US 5/28: Hepatic steatosis w/ patent porta vein & normal flow. No biliary ductal dilation.  CULTURES: OSH  UC 5/22:  Negative BCx2 5/21:  Negative  Tracheal Aspirate 5/21: Normal Oral Flora  BCx2 5/26 >>> Tracheal Aspirate 5/26:  Oral Flora  U. Strep 5/26:  Negative U. Legionella 5/26:  Negative   UrC 5/29>>res enteroccus BC x2 5/30>>coag neg staph Trach Asp 5/30>>  ANTIBIOTICS: vanc 5/31>>> Imiepenem 5/31>>> 6/2 Zosyn 6/3 >>  SIGNIFICANT EVENTS: 5/21  [name] with [facility] distress, intubated, thought CAP, +/- CHF  5/28 collapse left lung  LINES/TUBES: ETT 5/21 >> exchanged 5/26 >>  OGT >> Foley >> PIV x2  ASSESSMENT / PLAN:  PULMONARY A: Acute Hypoxemic Respiratory Failure - Suspect pulmonary edema from diastolic CHF. CAP - Unlikely. Cultures negative so far. Bilateral Pleural Effusions Suspected COPD  Possible Aspiration Event - pm 5/27 with vomiting / OGT coiled in mouth Collapse 5/26 - resolved without intervention  P:   Wean PEEP FiO2 Stop diuresis as Cr is higher Likely heading towards trach. CXR with stable opacities  CARDIOVASCULAR A:  Acute Diastolic CHF  H/O HTN & HLD Elevated Troponin I - Likely demand ischemia. Atherosclerotic CAD - Noted on CT scan. Ascending Thoracic Aneurysm - [facility]    P:  Lasix for diuresis > Hold RENAL A:   Pos balance daily hypernatremia P:   Monitor I/O  GASTROINTESTINAL A:   Morbid Obesity  Transaminitis - Likely congestive hepatopathy w/ CHF.  Hepatic Steatosis/Early Cirrhosis - Seen on RUQ U/S. Prior h/o EtOH stopped 1990s. No BM  P:   Continue TF Protonix VT daily Senna VT bid  HEMATOLOGIC A:   Leukocytosis DVT prevention  P:  Heparin [place] for DVT prophylaxis Get coags in am   INFECTIOUS A:   CAUTI? P:   On vanco, zosyn. Cultures reviewed  ENDOCRINE A:   DM II  - Blood  glucose uncontrolled but improved  P:   Accu-Checks q4hr SSI per Resistant Algorithm NPH 55u [name] to q12h TF coverage to 5  NEUROLOGIC A:   Acute Encephalopathy - Multifactorial w/ hypoxia.  P:   RASS goal: -1 Fentanyl gtt wua Versed drip off 6/1, prn versed Start propofol for altered mental status. Seroquel qhs 100  FAMILY  - Updates:   - Inter-disciplinary family meet or Palliative Care meeting due by:  6/1  Interdisciplinary Goals of Care Family Meeting    Date carried out:: 08/14/2015  Location of the meeting: Conference room  Member's involved: Physician, Bedside Registered Nurse and Family Member or next of kin  Durable Power of Attorney or acting medical decision maker: His two daughters and sons in low were present.    Discussion: We discussed goals of care for Zollie Beckersoy N Housewright .  I explained that we are not making much progress with his vent inspite of aggressive diuresis and antibiotics. If we were to continue current care he will  need a trach. But as per the family Mr. Bernhart has made his wishes very clear in past that he would not want to be supported like this and would not want a trach.  We have decided to make him DNR, no aggressive care, focus on comfort. I will stop all his active meds and continue sedation, vent for now. The family want some time for the rest of his kin to say their goodbyes. Anticipate terminal extubation with full comfort measures tomorrow AM 6/5  Code status: Limited Code or DNR with short term  Disposition: In-patient comfort care  Time spent for the meeting: 15 Critical care time - 30 min    Chilton Greathouse MD [place] [facility] Pager [phone] If no answer or after 3pm call: [phone] 08/14/2015, 8:59 AM

## 2015-08-14 NOTE — Progress Notes (Signed)
[facility] FOR AM LAB REPLACEMENT ONLY  The patient does apply for the [facility] Electrolyte Replacment Protocol based on the criteria listed below:   1. Is GFR >/= 40 ml/min? Yes.    Patient's GFR today is 49 2. Is urine output >/= 0.5 ml/kg/hr for the last 6 hours? Yes.   Patient's UOP is 1.0 ml/kg/hr 3. Is BUN < 60 mg/dL? Yes.     Patient's BUN today is 59 4. Abnormal electrolyte(s): K 2.9 5. Ordered repletion with: per protocol 6. If a panic level lab has been reported, has the CCM MD in charge been notified? No..   Physician:    Markus DaftWHELAN, Kristi Hyer A 08/14/2015 4:13 AM

## 2015-08-15 LAB — CBC WITH DIFFERENTIAL/PLATELET
BASOS ABS: 0 10*3/uL (ref 0.0–0.1)
Basophils Relative: 0 %
EOS ABS: 0.5 10*3/uL (ref 0.0–0.7)
EOS PCT: 4 %
HEMATOCRIT: 42.3 % (ref 39.0–52.0)
Hemoglobin: 12.8 g/dL — ABNORMAL LOW (ref 13.0–17.0)
LYMPHS ABS: 1.3 10*3/uL (ref 0.7–4.0)
LYMPHS PCT: 11 %
MCH: 28.1 pg (ref 26.0–34.0)
MCHC: 30.3 g/dL (ref 30.0–36.0)
MCV: 92.8 fL (ref 78.0–100.0)
MONO ABS: 0.6 10*3/uL (ref 0.1–1.0)
Monocytes Relative: 5 %
Neutro Abs: 9.1 10*3/uL — ABNORMAL HIGH (ref 1.7–7.7)
Neutrophils Relative %: 80 %
Platelets: 179 10*3/uL (ref 150–400)
RBC: 4.56 MIL/uL (ref 4.22–5.81)
RDW: 14.1 % (ref 11.5–15.5)
WBC: 11.5 10*3/uL — ABNORMAL HIGH (ref 4.0–10.5)

## 2015-08-15 LAB — MAGNESIUM: Magnesium: 2.3 mg/dL (ref 1.7–2.4)

## 2015-08-15 LAB — BASIC METABOLIC PANEL
ANION GAP: 12 (ref 5–15)
BUN: 80 mg/dL — ABNORMAL HIGH (ref 6–20)
CHLORIDE: 82 mmol/L — AB (ref 101–111)
CO2: 39 mmol/L — AB (ref 22–32)
Calcium: 8.5 mg/dL — ABNORMAL LOW (ref 8.9–10.3)
Creatinine, Ser: 1.57 mg/dL — ABNORMAL HIGH (ref 0.61–1.24)
GFR calc Af Amer: 49 mL/min — ABNORMAL LOW (ref >60)
GFR calc non Af Amer: 43 mL/min — ABNORMAL LOW (ref >60)
GLUCOSE: 607 mg/dL — AB (ref 65–99)
POTASSIUM: 3.9 mmol/L (ref 3.5–5.1)
Sodium: 133 mmol/L — ABNORMAL LOW (ref 135–145)

## 2015-08-15 LAB — PHOSPHORUS: PHOSPHORUS: 4.8 mg/dL — AB (ref 2.5–4.6)

## 2015-08-15 MED ORDER — MIDAZOLAM HCL 2 MG/2ML IJ SOLN: 1.0000 mg | INTRAMUSCULAR | Status: DC | PRN

## 2015-08-15 MED ORDER — MIDAZOLAM BOLUS VIA INFUSION
1.0000 mg | INTRAVENOUS | Status: DC | PRN
  Filled 2015-08-15: qty 2

## 2015-08-15 MED ORDER — MORPHINE BOLUS VIA INFUSION
5.0000 mg | INTRAVENOUS | Status: DC | PRN
  Filled 2015-08-15: qty 20

## 2015-08-15 MED ORDER — WHITE PETROLATUM GEL
Status: AC
  Administered 2015-08-15: 0.2
  Filled 2015-08-15: qty 1

## 2015-08-15 MED ORDER — SODIUM CHLORIDE 0.9 % IV SOLN
0.0000 mg/h | INTRAVENOUS | Status: DC
  Administered 2015-08-15: 2 mg/h via INTRAVENOUS
  Filled 2015-08-15: qty 10

## 2015-08-15 MED ORDER — MIDAZOLAM HCL 5 MG/ML IJ SOLN: 2.0000 mg | INTRAMUSCULAR | Status: DC | PRN

## 2015-08-15 MED ORDER — MIDAZOLAM HCL 2 MG/2ML IJ SOLN
2.0000 mg | INTRAMUSCULAR | Status: DC | PRN
  Administered 2015-08-15: 2 mg via INTRAVENOUS
  Filled 2015-08-15: qty 2

## 2015-08-15 MED ORDER — MORPHINE SULFATE 25 MG/ML IV SOLN
10.0000 mg/h | INTRAVENOUS | Status: DC
  Administered 2015-08-15: 10 mg/h via INTRAVENOUS
  Filled 2015-08-15: qty 10

## 2015-08-15 MED ORDER — IPRATROPIUM-ALBUTEROL 0.5-2.5 (3) MG/3ML IN SOLN: 3.0000 mL | RESPIRATORY_TRACT | Status: DC | PRN

## 2015-08-15 MED ORDER — WHITE PETROLATUM GEL
Status: AC
  Filled 2015-08-15: qty 1

## 2015-08-19 ENCOUNTER — Telehealth: Payer: Self-pay

## 2015-08-19 NOTE — Telephone Encounter (Signed)
On 08/19/2015 I received a death certificate from [facility] [employer] [employer] (original). The death certificate is for burial. The patient is a patient of Doctor Maneem. The death certificate will be taken to Pulmonary Unit @ [facility] this pm for signature. On 08/22/2015 I received the death certificate back from Doctor Maneem. I got the death certificate ready and called the funeral home to let them know I [facility] their request.

## 2015-09-10 NOTE — Progress Notes (Addendum)
CRITICAL VALUE ALERT  Critical value received:  Glucose 607  Date of notification:  08/21/2015  Time of notification:  05:22  Critical value read back:Yes.    Nurse who received alert:  BShepherd, RN  MD notified (1st page):  Christene Slatese Dios  Time of first page:  05:25  MD notified (2nd page):  Time of second page:  Responding MD:  Christene Slatese Dios  Time MD responded:  05:28

## 2015-09-10 NOTE — Progress Notes (Signed)
   08/25/2015 1300  Clinical Encounter Type  Visited With Patient;Family;Patient and family together;Health care provider  Visit Type Initial;Follow-up;Psychological support;Spiritual support;Social support;Patient actively dying  Referral From Family;Nurse  Consult/Referral To Chaplain;Faith community  Spiritual Encounters  Spiritual Needs Literature;Prayer;Emotional;Grief support  Stress Factors  Patient Stress Factors None identified  Family Stress Factors Exhausted;Health changes;Loss;Loss of control;Major life changes  Advance Directives (For Healthcare)  Does patient have an advance directive? No   Chaplain followed up to provide emotional and spiritual support via prayer.

## 2015-09-10 NOTE — Care Management Important Message (Signed)
Important Message  Patient Details  Name: Zollie BeckersRoy N Cho MRN: [id number] Date of Birth: [birth year]   Medicare Important Message Given:  Yes    Kyla BalzarineShealy, Tyreik Delahoussaye Abena 08/12/2015, 11:46 AM

## 2015-09-10 NOTE — Progress Notes (Deleted)
Inpatient Diabetes Program Recommendations  AACE/ADA: New Consensus Statement on Inpatient Glycemic Control (2015)  Target Ranges:  Prepandial:   less than 140 mg/dL      Peak postprandial:   less than 180 mg/dL (1-2 hours)      Critically ill patients:  140 - 180 mg/dL

## 2015-09-10 NOTE — Discharge Summary (Signed)
Name:Romone Domenica Reamer Heideman HKV:[id number] DOB:[birth year]   ADMISSION DATE: 07/30/2015 DATE OF DEATH: 08/25/15  70 y/o M, smoker, with PMH of DM, HTN, HLD, Gout, Osteoarthritis, and COPD (pt apparently not aware of dx) who [facility] with complaints of shortness of breath. Work up was concerning for hypercarbic respiratory failure with ABG showing PCO2 >100. He decompensated and required intubation. [facility].   He was treated with broad spectrum antibiotics and diuresed aggressively to deal with volume overload, CHF. He did not improve and developed ARDS with worsening lung function, compliance and oxygenation. A family meeting was held where we discussed goals of care for Mr.Harriss . The family were told that we are not making much progress with his vent inspite of aggressive diuresis and antibiotics. A tracheotomy was discussed. But as per the family Mr. Lucia EstelleDorsett has made his wishes very clear in past that he would not want to be supported like this and would not want a trach.  We decided to transition to comfort care. He was extubated on 08/16/2015 and passed away shortly afterwards.  Chilton GreathousePraveen Bellagrace Sylvan MD [place] [facility] Pager [phone] If no answer or after 3pm call: [phone] 08/29/2015, 3:29 PM

## 2015-09-10 NOTE — Procedures (Signed)
Extubation Procedure Note  Patient Details:   Name: Zachary Blevins DOB: [birth year] MRN: [id number]   Airway Documentation:     Evaluation  O2 sats: transiently fell during during procedure Complications: No apparent complications Patient did tolerate procedure well. Bilateral Breath Sounds: Clear, Diminished   No   Pt extubated to Room Air per Withdrawal of Life protocol. Pt spo2 began to decrease upon extubation. Pt not in distress at this time. RT will continue to monitor.   Carolan ShiverKelley, Zachary Blevins 08/19/2015, 11:27 AM

## 2015-09-10 NOTE — Progress Notes (Signed)
PULMONARY / CRITICAL CARE MEDICINE   Name: Zachary Blevins MRN: [id number] DOB: April 12, 1945    ADMISSION DATE:  08/01/2015 CONSULTATION DATE:  07/26/2015  REFERRING MD:  [facility]   CHIEF COMPLAINT:  Acute Respiratory Failure   SUBJECTIVE: Family meeting yesterday. Plan for terminal extubation with comfort today.  VITAL SIGNS: BP 106/49 mmHg  Pulse 71  Temp(Src) 98.8 F (37.1 C) (Oral)  Resp 18  Ht  (1.803 m)  Wt 304 lb 10.8 oz (138.2 kg)  BMI 42.51 kg/m2  SpO2 90%  HEMODYNAMICS:    VENTILATOR SETTINGS: Vent Mode:  [-] PRVC FiO2 (%):  [80 %-90 %] 80 % Set Rate:  [18 bmp] 18 bmp Vt Set:  [600 mL] 600 mL PEEP:  [10 cmH20] 10 cmH20 Plateau Pressure:  [21 cmH20-24 cmH20] 21 cmH20  INTAKE / OUTPUT: I/O last 3 completed shifts: In: 6508.2 [I.V.:2819.2; NG/GT:2115; IV Piggyback:1574] Out: 5135 [Urine:5135]  PHYSICAL EXAMINATION: General:  Morbidly obese, No distress Neuro:  No focal deficits HEENT:  No thyromegaly, JVD Cardiovascular:  S1, S2, RRR, No MRG Lungs: B/L rhonchi, + BS Abdomen:  Obese, Soft. Low BS. Integument:  Warm & dry. No rash.  LABS:  BMET  Recent Labs Lab 08/14/15 0245 08/14/15 2028 Aug 27, 2015 0420  NA 139 134* 133*  K 2.9* 3.9 3.9  CL 86* 82* 82*  CO2 40* 39* 39*  BUN 59* 76* 80*  CREATININE 1.40* 1.54* 1.57*  GLUCOSE 319* 525* 607*    Electrolytes  Recent Labs Lab 08/13/15 0425 08/14/15 0245 08/14/15 2028 08-27-2015 0420  CALCIUM 9.2 9.1 8.7* 8.5*  MG 2.0 2.2  --  2.3  PHOS 4.8* 5.9*  --  4.8*    CBC  Recent Labs Lab 08/13/15 0425 08/14/15 0245 08-27-2015 0420  WBC 12.4* 12.5* 11.5*  HGB 14.1 13.9 12.8*  HCT 47.1 45.1 42.3  PLT 199 183 179    Coag's  Recent Labs Lab 08/13/15 0425  INR 1.11    Sepsis Markers  Recent Labs Lab 08/09/15 1136 08/10/15 0240 08/11/15 0330  PROCALCITON 0.21 0.25 0.25    ABG  Recent Labs Lab 08/12/15 2317 08/14/15 0341  PHART 7.441 7.455*  PCO2ART 59.9* 58.8*   PO2ART 54.0* 54.9*    Liver Enzymes  Recent Labs Lab 08/12/15 0357 08/13/15 0425 08/14/15 0245  AST 62* 65* 46*  ALT 76* 118* 106*  ALKPHOS 188* 244* 242*  BILITOT 1.1 1.1 1.3*  ALBUMIN 2.0* 2.1* 2.0*    Cardiac Enzymes No results for input(s): TROPONINI, PROBNP in the last 168 hours.  Glucose  Recent Labs Lab 08/14/15 0631 08/14/15 0743 08/14/15 0847 08/14/15 1250 08/14/15 1654 08/14/15 1923  GLUCAP 244* 182* 176* 295* 513* 487*    Imaging - IMAGES PERSONALLY REVIEWED No results found.  STUDIES:  CT Chest 5/26 (OSH): no evidence of PE, extensive atx of LUL, lingula, bilateral lower lobes, small bilateral effusions, 4.5 cm ascending thoracic aortic aneurysm recommended for semi-annual follow up with Thoracic Surgery ECHO 5/26: LVEF 60-65%, no wall motion, grade 1 diastolic dysfunction, mild aortic root dilation RUQ [place] 5/28: Hepatic steatosis w/ patent porta vein & normal flow. No biliary ductal dilation.  CULTURES: OSH  UC 5/22:  Negative BCx2 5/21:  Negative  Tracheal Aspirate 5/21: Normal Oral Flora  BCx2 5/26 >>> Tracheal Aspirate 5/26:  Oral Flora  U. Strep 5/26:  Negative U. Legionella 5/26:  Negative   UrC 5/29>>res enteroccus BC x2 5/30>>coag neg staph Trach Asp 5/30>>  ANTIBIOTICS: vanc 5/31>>> Imiepenem  5/31>>> 6/2 Zosyn 6/3 >>  SIGNIFICANT EVENTS: 5/21  [facility] with [facility] distress, intubated, thought CAP, +/- CHF  5/28 collapse left lung  LINES/TUBES: ETT 5/21 >> exchanged 5/26 >>  OGT >> Foley >> PIV x2  ASSESSMENT / PLAN:  PULMONARY A: Acute Hypoxemic Respiratory Failure - Suspect pulmonary edema from diastolic CHF. CAP - Unlikely. Cultures negative so far. Bilateral Pleural Effusions Suspected COPD  Possible Aspiration Event - pm 5/27 with vomiting / OGT coiled in mouth Collapse 5/26 - resolved without intervention  P:   Terminal extubation  CARDIOVASCULAR A:  Acute Diastolic CHF  H/O HTN &  HLD Elevated Troponin I - Likely demand ischemia. Atherosclerotic CAD - Noted on CT scan. Ascending Thoracic Aneurysm - [facility]    P:  Lasix for diuresis > Hold  RENAL A:   Pos balance daily hypernatremia P:   Monitor I/O  GASTROINTESTINAL A:   Morbid Obesity  Transaminitis - Likely congestive hepatopathy w/ CHF.  Hepatic Steatosis/Early Cirrhosis - Seen on RUQ U/S. Prior h/o EtOH stopped 1990s. No BM  P:   Stop feeds  HEMATOLOGIC A:   Leukocytosis DVT prevention  P:    INFECTIOUS A:   CAUTI? P:   D/C abx  ENDOCRINE A:   DM II  - Blood glucose uncontrolled but improved  P:   Accu-Checks q4hr SSI per Resistant Algorithm NPH 55u  to q12h TF coverage to 5  NEUROLOGIC A:   Acute Encephalopathy - Multifactorial w/ hypoxia.  P:   RASS goal: -1 Morphine for comfort  FAMILY  - Updates:  Terminal extubation today with comfort. - Inter-disciplinary family meet or Palliative Care meeting due by:  6/1   Chilton GreathousePraveen Areej Tayler MD [facility] Pager [phone] If no answer or after 3pm call: [phone] 08/21/2015, 10:31 AM

## 2015-09-10 NOTE — Progress Notes (Signed)
eLink Physician-Brief Progress Note Patient Name: Zachary BeckersRoy N Gregson DOB: 27-Jul-1945 MRN: [id number]   Date of Service  08/30/2015  HPI/Events of Note  RN calls as pt's FBS was 600 mg%.  Chart reviewed.  Dr. Isaiah SergeMannam d/w family regarding over all poor prognosis.  Plan to terminally extubate pt this am.   eICU Interventions  Will not treat elevated FBS. Will d/c meds/labs. Just keep sedation and vent.         Jose Bridgette Habermannngelo A De Dios 09/07/2015, 5:32 AM

## 2015-09-10 NOTE — Progress Notes (Signed)
125 cc fentanyl wasted in sink. Witnessed with Marica OtterMaria Withrow, RN

## 2015-09-10 NOTE — Progress Notes (Signed)
No pulse, asystole on the monitor. No heart tones. No respirations. Patient DNR. Family at the bedside. Emotional support provided.Dr. Isaiah SergeMannam notified.

## 2015-09-10 NOTE — Progress Notes (Signed)
   20-Aug-2015 0815  Clinical Encounter Type  Visited With Patient and family together  Visit Type Patient actively dying  Referral From Nurse  Spiritual Encounters  Spiritual Needs Prayer;Grief support;Emotional  Stress Factors  Patient Stress Factors None identified  Family Stress Factors Loss;Major life changes  Called to end of life support.  Shift change. Met with family and offered prayer.  Informed family a different cha[lain would be taking over.

## 2015-09-10 DEATH — deceased

## 2017-10-23 IMAGING — CR DG ABD PORTABLE 1V
2 series · 2 of 2 positions shown · non-contrast
Comparison: Yesterday

CLINICAL DATA: OG tube placement.

EXAM:
PORTABLE ABDOMEN - 1 VIEW

[AP (1 of 2)]
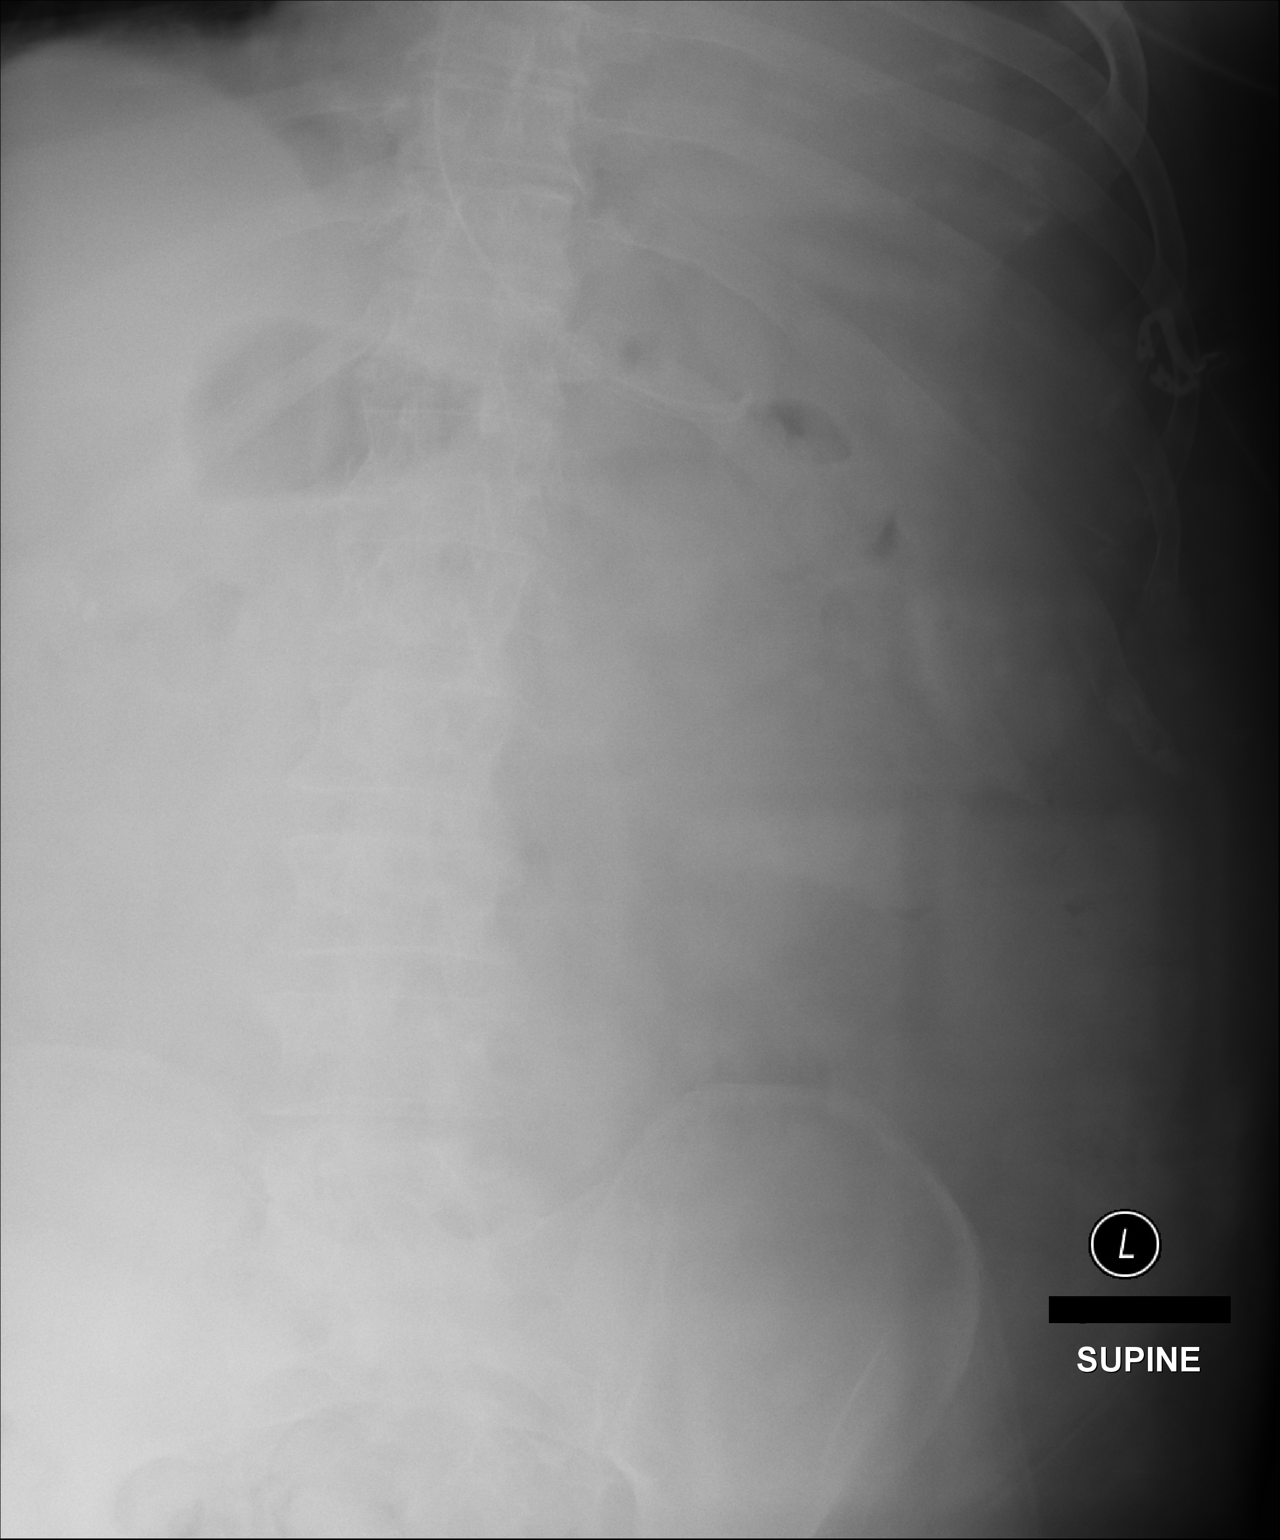

[AP (2 of 2)]
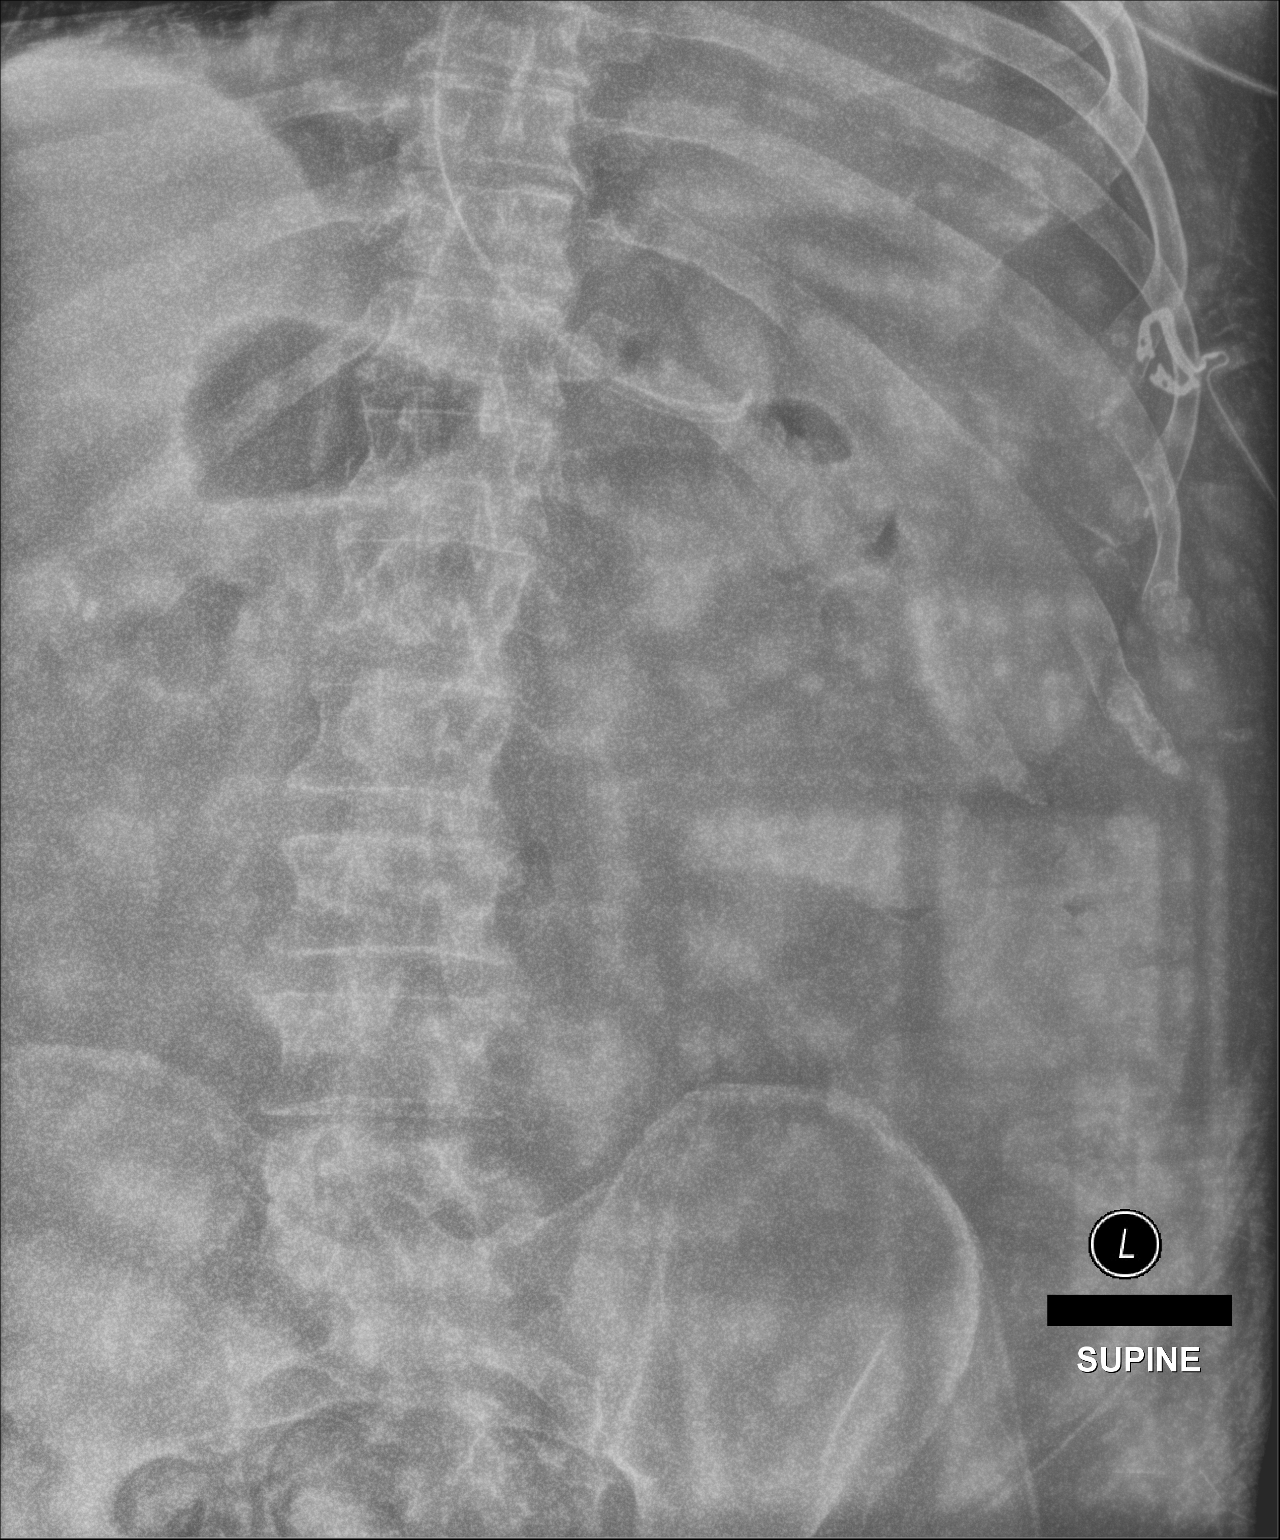

[2 of 2 positions shown; findings below may reference images not displayed]

FINDINGS: Tip and side port of the enteric tube below the diaphragm in the
stomach. No bowel dilatation in the upper abdomen.
IMPRESSION: Tip and side port of the enteric tube in the stomach.

## 2017-10-27 IMAGING — CR DG CHEST 1V PORT
2 series · 2 of 2 positions shown · non-contrast
Comparison: 08/11/2015.

CLINICAL DATA: Pneumonia.

EXAM:
PORTABLE CHEST 1 VIEW

[AP (1 of 2)]
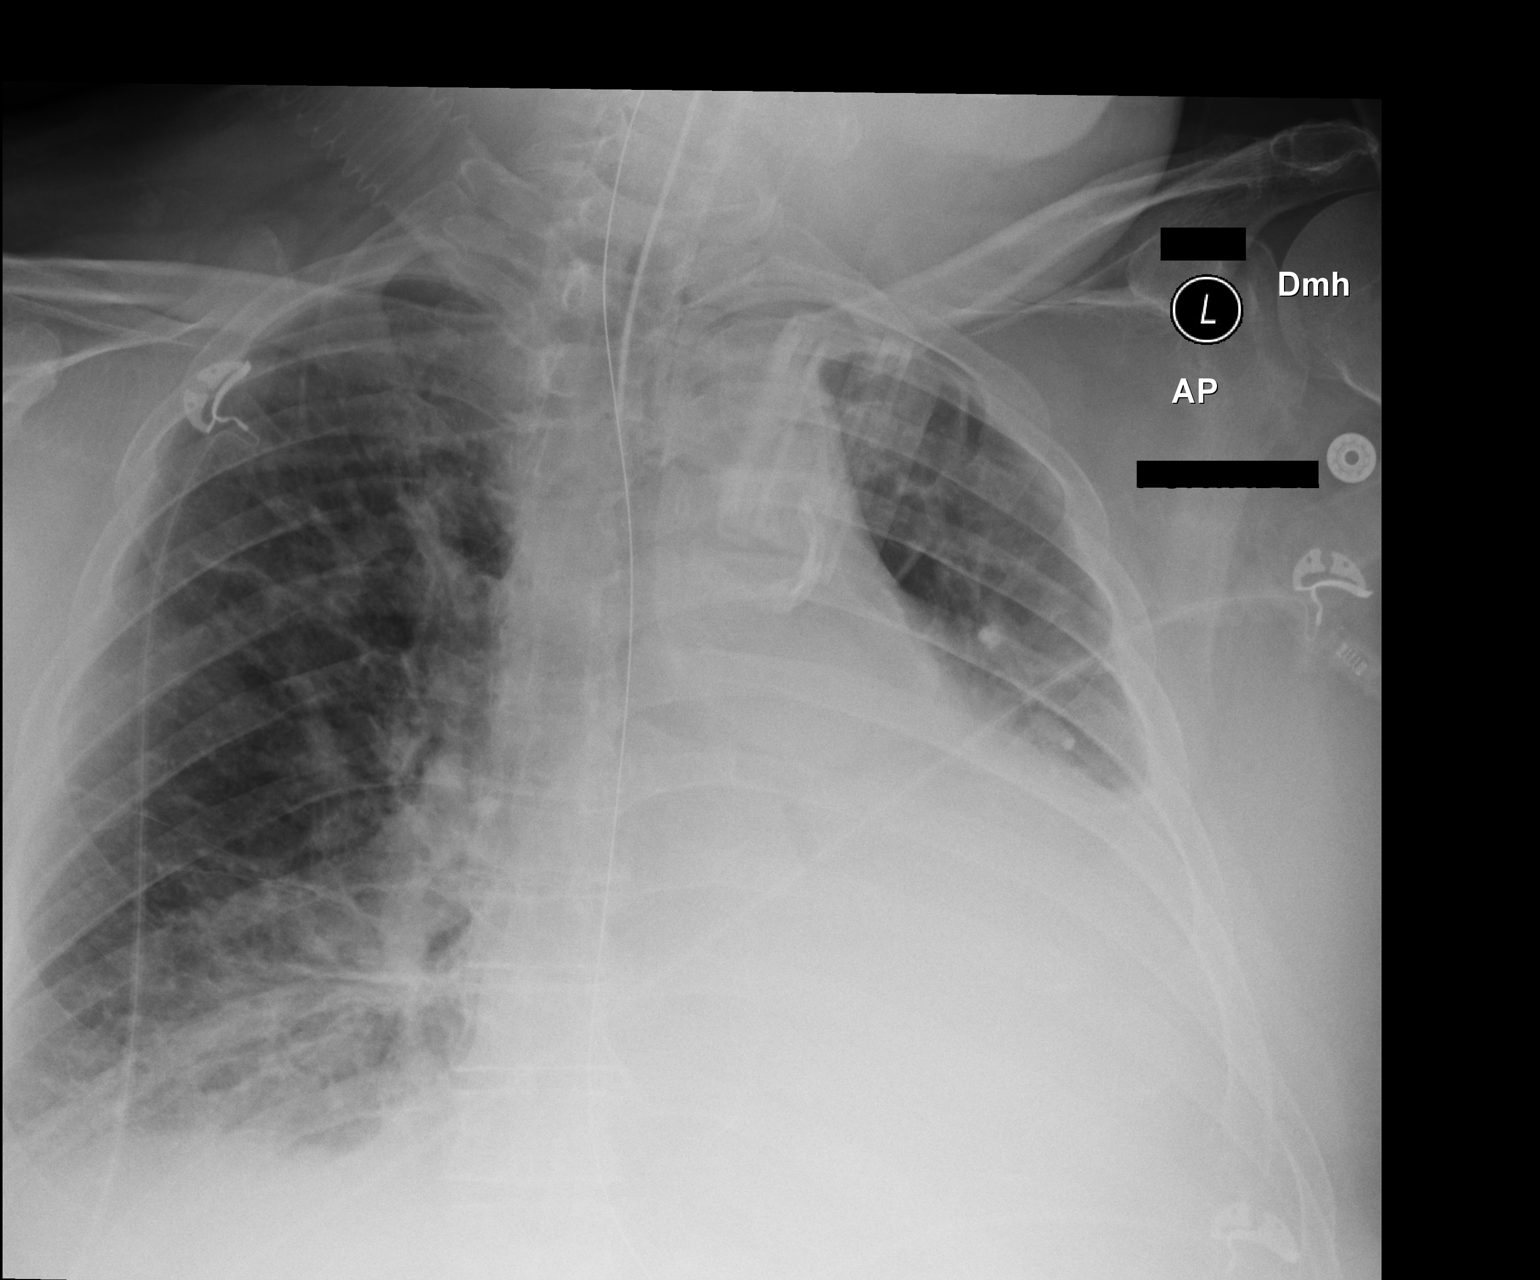

[AP (2 of 2)]
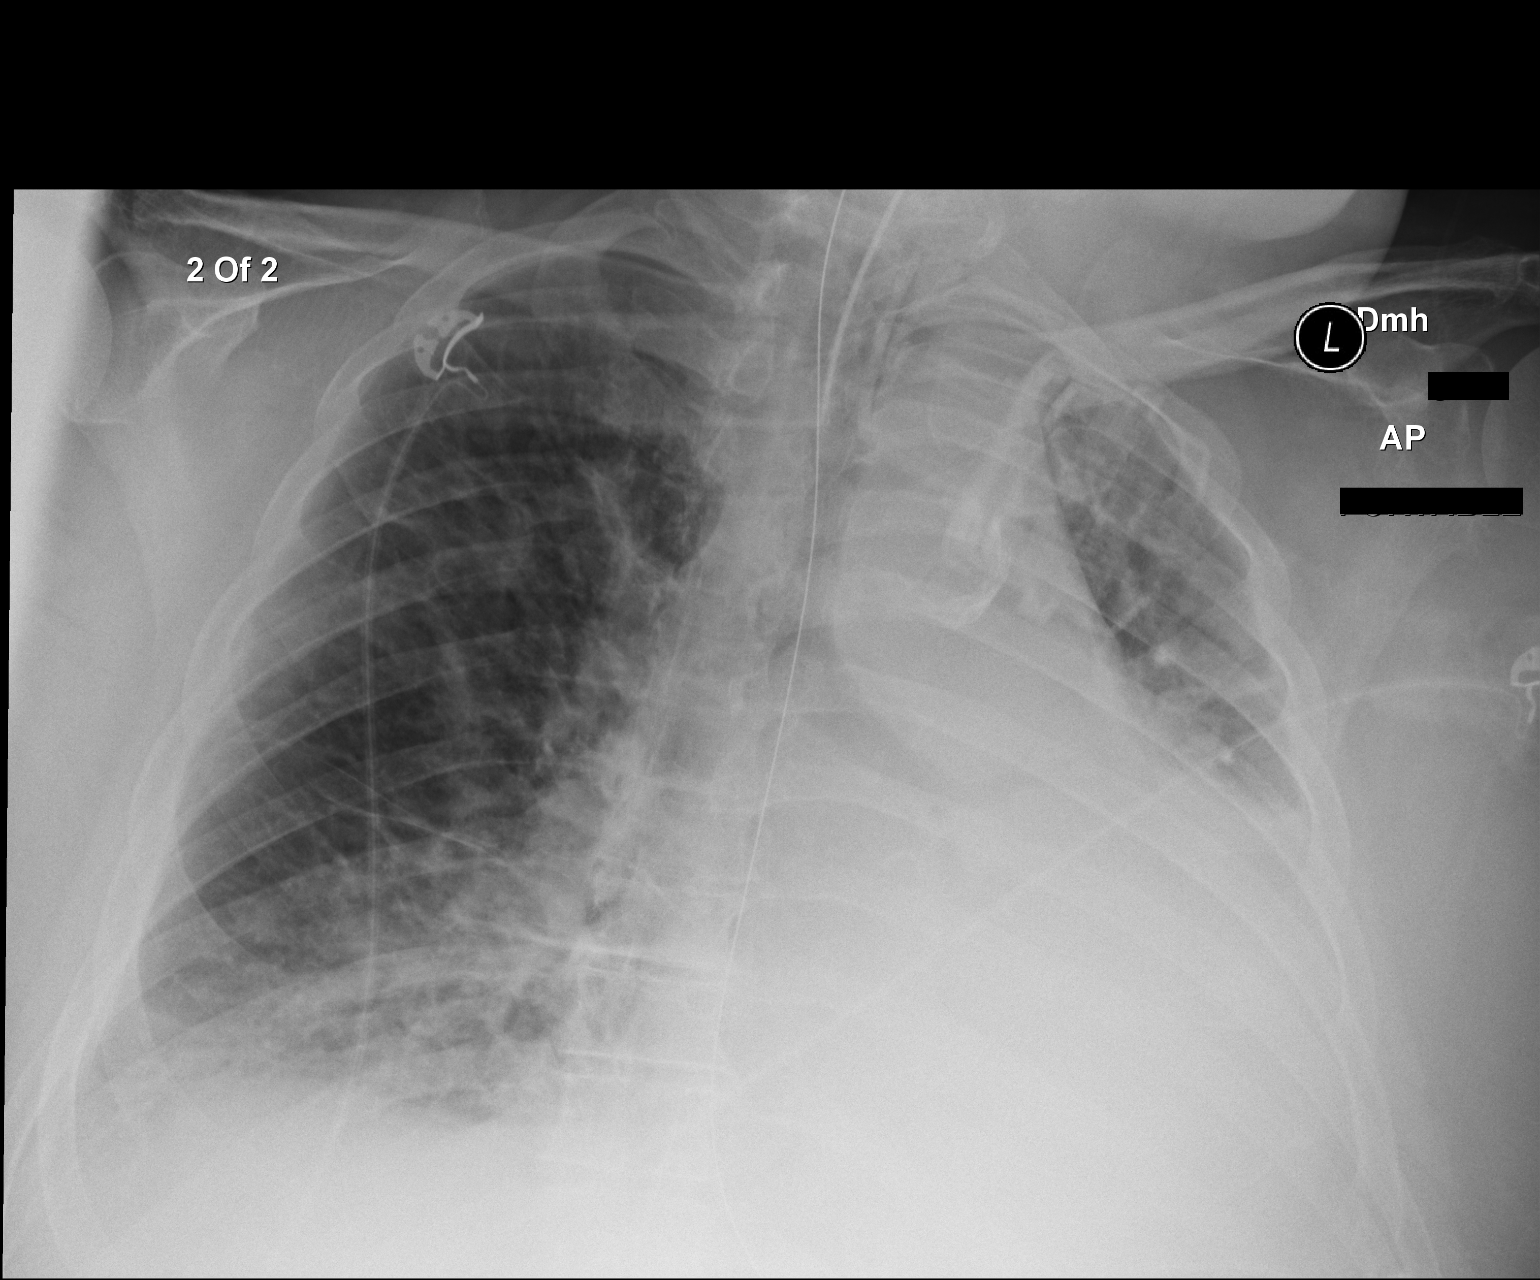

[2 of 2 positions shown; findings below may reference images not displayed]

FINDINGS: Endotracheal tube and NG tube in stable position. Cardiomegaly with
diffuse bilateral pulmonary infiltrates consistent with pulmonary
edema and/or pneumonia. No interim clearing. Bilateral pleural
effusions. No pneumothorax.
IMPRESSION: 1.  Lines and tubes in stable position.

2. Cardiomegaly with diffuse bilateral pulmonary alveolar
infiltrates and pleural effusions consistent with congestive heart
failure. No significant change from prior exam. Bilateral pneumonia
could also present in this fashion.
